# Patient Record
Sex: Female | Born: 1995
Health system: Southern US, Community
[De-identification: ages and names within clinical notes are randomized; demographics above are authoritative.]

## PROBLEM LIST (undated history)

## (undated) DIAGNOSIS — R42 Dizziness and giddiness: Secondary | ICD-10-CM

---
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
## 2014-03-18 ENCOUNTER — Ambulatory Visit (INDEPENDENT_AMBULATORY_CARE_PROVIDER_SITE_OTHER): Payer: 59 | Admitting: Family Medicine

## 2014-03-18 ENCOUNTER — Telehealth: Payer: Self-pay

## 2014-03-18 ENCOUNTER — Other Ambulatory Visit: Payer: Self-pay | Admitting: Family Medicine

## 2014-03-18 VITALS — BP 116/72 | HR 90 | Temp 99.0°F | Resp 18 | Ht 64.5 in | Wt 120.0 lb

## 2014-03-18 DIAGNOSIS — R059 Cough, unspecified: Secondary | ICD-10-CM

## 2014-03-18 DIAGNOSIS — R05 Cough: Secondary | ICD-10-CM

## 2014-03-18 DIAGNOSIS — Z209 Contact with and (suspected) exposure to unspecified communicable disease: Secondary | ICD-10-CM

## 2014-03-18 DIAGNOSIS — Z0000 Encounter for general adult medical examination without abnormal findings: Secondary | ICD-10-CM

## 2014-03-18 DIAGNOSIS — Z13220 Encounter for screening for lipoid disorders: Secondary | ICD-10-CM

## 2014-03-18 DIAGNOSIS — Z23 Encounter for immunization: Secondary | ICD-10-CM

## 2014-03-18 DIAGNOSIS — Z1329 Encounter for screening for other suspected endocrine disorder: Secondary | ICD-10-CM

## 2014-03-18 DIAGNOSIS — Z131 Encounter for screening for diabetes mellitus: Secondary | ICD-10-CM

## 2014-03-18 DIAGNOSIS — Z130 Encounter for screening for diseases of the blood and blood-forming organs and certain disorders involving the immune mechanism: Secondary | ICD-10-CM

## 2014-03-18 LAB — CBC
HCT: 39.8 % (ref 36.0–46.0)
Hemoglobin: 13.3 g/dL (ref 12.0–15.0)
MCH: 28.6 pg (ref 26.0–34.0)
MCHC: 33.4 g/dL (ref 30.0–36.0)
MCV: 85.6 fL (ref 78.0–100.0)
PLATELETS: 233 10*3/uL (ref 150–400)
RBC: 4.65 MIL/uL (ref 3.87–5.11)
RDW: 13.4 % (ref 11.5–15.5)
WBC: 6.1 10*3/uL (ref 4.0–10.5)

## 2014-03-18 MED ORDER — HYDROCODONE-HOMATROPINE 5-1.5 MG PO TABS: ORAL_TABLET | ORAL | Status: DC

## 2014-03-18 MED ORDER — HYDROCODONE-HOMATROPINE 5-1.5 MG/5ML PO SYRP: 5.0000 mL | ORAL_SOLUTION | Freq: Three times a day (TID) | ORAL | Status: DC | PRN

## 2014-03-18 MED ORDER — BENZONATATE 100 MG PO CAPS: 100.0000 mg | ORAL_CAPSULE | Freq: Three times a day (TID) | ORAL | Status: DC | PRN

## 2014-03-18 MED ORDER — BENZONATATE 100 MG PO CAPS: 100.0000 mg | ORAL_CAPSULE | Freq: Two times a day (BID) | ORAL | Status: DC | PRN

## 2014-03-18 NOTE — Telephone Encounter (Signed)
Patient's mother called. States that every pharmacy they've taken the hydrocodine Dr. Patsy Lageropland wrote her today will not fill the rx. Mother states she was told that no one carries this med. Please return call and advise. CB # [phone]

## 2014-03-18 NOTE — Patient Instructions (Signed)
Good to see you today!  Set up your mychart account asap so I can send you your lab results Use the tessalon perles as needed for cough- if you need something stronger you can use the hycodan syrup The hycodan will make you sleepy so do not use when you need to drive!   Best wishes for success in your studies!

## 2014-03-18 NOTE — Progress Notes (Signed)
[facility] [address], [place] [id number] [phone]  Date:  03/18/2014   Name:  Leah Clayton   DOB:  1995/11/22   MRN:  [id number]  PCP:  No primary care provider on file.    Chief Complaint: Annual Exam   History of Present Illness:  Leah Kingfishermani Chaires is a 18 y.o. very pleasant female patient who presents with the following:  She is here today for labs/ PE for school.   She is entering a 2 year [employer]N program at [employer].  Per he forms she needs a hep b titer and quantiferon gold test  She also notes a cough for 3-4 days, it can be worse at night.  No fever, she generally feels ok.  She would like something to help her not cough at night.   She does have a history of asthma but this is no longer bothersome to her Never a smoker Depo- provera   There are no active problems to display for this patient.   Past Medical History  Diagnosis Date  . Asthma     No past surgical history on file.  History  Substance Use Topics  . Smoking status: Never Smoker   . Smokeless tobacco: Not on file  . Alcohol Use: No    No family history on file.  Allergies  Allergen Reactions  . Shellfish Allergy Itching and Swelling    Medication list has been reviewed and updated.  No current outpatient prescriptions on file prior to visit.   No current facility-administered medications on file prior to visit.    Review of Systems:  As per HPI- otherwise negative.   Physical Examination: Filed Vitals:   03/18/14 1415  BP: 116/72  Pulse: 90  Temp: 99 F (37.2 C)  Resp: 18   Filed Vitals:   03/18/14 1415  Height: 5' 4.5" (1.638 m)  Weight: 120 lb (54.432 kg)   Body mass index is 20.29 kg/(m^2). Ideal Body Weight: Weight in (lb) to have BMI = 25: 147.6  GEN: WDWN, NAD, Non-toxic, A & O x 3, looks well HEENT: Atraumatic, Normocephalic. Neck supple. No masses, No LAD.  Bilateral TM wnl, oropharynx normal.  PEERL,EOMI.   Ears and Nose: No external  deformity. CV: RRR, No M/G/R. No JVD. No thrill. No extra heart sounds. PULM: CTA B, no wheezes, crackles, rhonchi. No retractions. No resp. distress. No accessory muscle use. ABD: S, NT, ND EXTR: No c/c/e NEURO Normal gait.  PSYCH: Normally interactive. Conversant. Not depressed or anxious appearing.  Calm demeanor.    Assessment and Plan: Physical exam  Exposure to communicable disease - Plan: Flu Vaccine QUAD 36+ mos IM, Hepatitis B surface antibody  Immunization due - Plan: Quantiferon tb gold assay, Measles/Mumps/Rubella Immunity  Screening for hyperlipidemia - Plan: Lipid panel  Screening for deficiency anemia - Plan: CBC  Screening for hypothyroidism - Plan: TSH  Screening for diabetes mellitus - Plan: Comprehensive metabolic panel  Cough - Plan: Hydrocodone-Homatropine 5-1.5 MG TABS, benzonatate (TESSALON) 100 MG capsule, DISCONTINUED: benzonatate (TESSALON) 100 MG capsule, DISCONTINUED: HYDROcodone-homatropine (HYCODAN) 5-1.5 MG/5ML syrup  Labs and quantiferon/ flu shot as above Tessalon perles and hycodan tablets (per request) for cough; let me know if not better soon  Signed Abbe AmsterdamJessica Sundae Maners, MD

## 2014-03-19 LAB — COMPREHENSIVE METABOLIC PANEL
ALBUMIN: 4.5 g/dL (ref 3.5–5.2)
ALK PHOS: 74 U/L (ref 39–117)
ALT: 9 U/L (ref 0–35)
AST: 15 U/L (ref 0–37)
BILIRUBIN TOTAL: 1.4 mg/dL — AB (ref 0.2–1.2)
BUN: 13 mg/dL (ref 6–23)
CO2: 24 mEq/L (ref 19–32)
Calcium: 9.4 mg/dL (ref 8.4–10.5)
Chloride: 104 mEq/L (ref 96–112)
Creat: 0.65 mg/dL (ref 0.50–1.10)
GLUCOSE: 83 mg/dL (ref 70–99)
POTASSIUM: 3.9 meq/L (ref 3.5–5.3)
SODIUM: 139 meq/L (ref 135–145)
TOTAL PROTEIN: 7.3 g/dL (ref 6.0–8.3)

## 2014-03-19 LAB — TSH: TSH: 1.114 u[IU]/mL (ref 0.350–4.500)

## 2014-03-19 LAB — HEPATITIS B SURFACE ANTIBODY, QUANTITATIVE: HEPATITIS B-POST: 0 m[IU]/mL

## 2014-03-19 LAB — LIPID PANEL
Cholesterol: 93 mg/dL (ref 0–200)
HDL: 64 mg/dL (ref >39)
LDL CALC: 21 mg/dL (ref 0–99)
TRIGLYCERIDES: 39 mg/dL (ref <150)
Total CHOL/HDL Ratio: 1.5 Ratio
VLDL: 8 mg/dL (ref 0–40)

## 2014-03-19 NOTE — Telephone Encounter (Signed)
PATIENT'S MOTHER STATES HER DAUGHTER SAW DR. Patsy LagerOPLAND ON Friday. SHE PRESCRIBED HER A NARCOTIC WHICH SHE HAS NOT BEEN ABLE TO FIND AT ANY PHARMACY. SHE CALLED BACK TO LET US KNOW THIS, BUT SHE NEVER RECEIVED A CALL BACK. IN THE MEANTIME, HER DAUGHTER'S COUGH IS GETTING WORSE. SHE WOULD LIKE A TABLET OR CAPSULE BE CALLED INTO HER PHARMACY AS SOON AS POSSIBLE PLEASE. HER DAUGHTER CAN NOT HAVE LIQUID. BEST PHONE [phone]  (MOTHER'S NAME IS KATINA)   PHARMACY CHOICE IS [facility] ON [address] ([place])  MBC

## 2014-03-20 LAB — QUANTIFERON TB GOLD ASSAY (BLOOD)
Interferon Gamma Release Assay: NEGATIVE
Mitogen value: 5.02 IU/mL
Quantiferon Nil Value: 0.02 IU/mL
Quantiferon Tb Ag Minus Nil Value: 0 IU/mL
TB AG VALUE: 0.02 [IU]/mL

## 2014-03-21 LAB — MEASLES/MUMPS/RUBELLA IMMUNITY
Mumps IgG: 108 AU/mL — ABNORMAL HIGH (ref <9.00)
RUBELLA: 3.27 {index} — AB (ref <0.90)
Rubeola IgG: 79 AU/mL — ABNORMAL HIGH (ref <25.00)

## 2014-03-21 NOTE — Telephone Encounter (Signed)
Lm for rtn call 

## 2014-03-21 NOTE — Telephone Encounter (Signed)
The pharmacy staff person I spoke with has never heard of Hycodan tablets. Another person there verifies that they  Have not carried the tablets in "quite some time."  We can send in something with codeine, or I can write a new prescription for something with hydrocodone.

## 2014-03-22 ENCOUNTER — Ambulatory Visit (INDEPENDENT_AMBULATORY_CARE_PROVIDER_SITE_OTHER): Payer: 59 | Admitting: Physician Assistant

## 2014-03-22 VITALS — BP 108/80 | HR 72 | Temp 98.8°F | Resp 16 | Ht 66.0 in | Wt 123.4 lb

## 2014-03-22 DIAGNOSIS — Z23 Encounter for immunization: Secondary | ICD-10-CM

## 2014-03-22 LAB — BILIRUBIN, FRACTIONATED(TOT/DIR/INDIR)
BILIRUBIN DIRECT: 0.4 mg/dL — AB (ref 0.0–0.3)
BILIRUBIN TOTAL: 1.3 mg/dL — AB (ref 0.2–1.2)
Indirect Bilirubin: 0.9 mg/dL (ref 0.2–1.2)

## 2014-03-22 NOTE — Progress Notes (Signed)
I was directly involved with the patient's care and agree with the physical, diagnosis and treatment plan.  

## 2014-03-22 NOTE — Progress Notes (Signed)
   Subjective:    Patient ID: Leah Clayton, female    DOB: 10/15/1995, 18 y.o.   MRN: [id number] There are no active problems to display for this patient.  Prior to Admission medications   Medication Sig Start Date End Date Taking? Authorizing Provider  benzonatate (TESSALON) 100 MG capsule Take 1 capsule (100 mg total) by mouth 3 (three) times daily as needed for cough. 03/18/14  Yes Pearline CablesJessica C Copland, MD  Hydrocodone-Homatropine 5-1.5 MG TABS Take 1 tablet by mouth every 8 hours as needed for cough 03/18/14  Yes Pearline CablesJessica C Copland, MD   Allergies  Allergen Reactions  . Shellfish Allergy Itching and Swelling    HPI  This is a 18 year old female presenting for 1st round of hepatitis B vaccination. 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 She will need two more vaccinations after today.  Review of Systems  Constitutional: Negative for fever and chills.  Skin: Negative.       Objective:   Physical Exam  Constitutional: She is oriented to person, place, and time. She appears well-developed and well-nourished. No distress.  HENT:  Head: Normocephalic and atraumatic.  Right Ear: Hearing normal.  Left Ear: Hearing normal.  Nose: Nose normal.  Eyes: Conjunctivae and lids are normal. Right eye exhibits no discharge. Left eye exhibits no discharge. No scleral icterus.  Pulmonary/Chest: Effort normal. No respiratory distress.  Musculoskeletal: Normal range of motion.  Neurological: She is alert and oriented to person, place, and time.  Skin: Skin is warm, dry and intact. No lesion and no rash noted.  Psychiatric: She has a normal mood and affect. Her speech is normal and behavior is normal. Thought content normal.      Assessment & Plan:  1. Need for hepatitis B vaccination Patient was given first round of hep B vaccine today. She will return in 1 month for her 2nd and 6 months for her  third.  Roswell MinersNicole V. Dyke BrackettBush, PA-C, MHS [facility]  03/22/2014

## 2014-03-22 NOTE — Telephone Encounter (Signed)
Clld pt - Pt advsd that she  did locate tablets [facility] had the [facility] in [facility] [address].

## 2014-03-22 NOTE — Patient Instructions (Signed)
Return in 1 month for 2nd vaccine. Return for 3rd dose in 6 months.

## 2015-07-17 DIAGNOSIS — Z3042 Encounter for surveillance of injectable contraceptive: Secondary | ICD-10-CM | POA: Diagnosis not present

## 2015-10-10 DIAGNOSIS — Z3042 Encounter for surveillance of injectable contraceptive: Secondary | ICD-10-CM | POA: Diagnosis not present

## 2015-12-29 DIAGNOSIS — Z6824 Body mass index (BMI) 24.0-24.9, adult: Secondary | ICD-10-CM | POA: Diagnosis not present

## 2015-12-29 DIAGNOSIS — Z124 Encounter for screening for malignant neoplasm of cervix: Secondary | ICD-10-CM | POA: Diagnosis not present

## 2015-12-29 DIAGNOSIS — Z113 Encounter for screening for infections with a predominantly sexual mode of transmission: Secondary | ICD-10-CM | POA: Diagnosis not present

## 2015-12-29 DIAGNOSIS — Z3042 Encounter for surveillance of injectable contraceptive: Secondary | ICD-10-CM | POA: Diagnosis not present

## 2015-12-29 DIAGNOSIS — Z1389 Encounter for screening for other disorder: Secondary | ICD-10-CM | POA: Diagnosis not present

## 2015-12-29 DIAGNOSIS — Z130 Encounter for screening for diseases of the blood and blood-forming organs and certain disorders involving the immune mechanism: Secondary | ICD-10-CM | POA: Diagnosis not present

## 2015-12-29 DIAGNOSIS — Z1151 Encounter for screening for human papillomavirus (HPV): Secondary | ICD-10-CM | POA: Diagnosis not present

## 2015-12-29 DIAGNOSIS — Z3009 Encounter for other general counseling and advice on contraception: Secondary | ICD-10-CM | POA: Diagnosis not present

## 2015-12-29 DIAGNOSIS — Z01419 Encounter for gynecological examination (general) (routine) without abnormal findings: Secondary | ICD-10-CM | POA: Diagnosis not present

## 2016-03-22 DIAGNOSIS — Z3042 Encounter for surveillance of injectable contraceptive: Secondary | ICD-10-CM | POA: Diagnosis not present

## 2016-06-09 DIAGNOSIS — R509 Fever, unspecified: Secondary | ICD-10-CM | POA: Diagnosis not present

## 2016-06-09 DIAGNOSIS — R112 Nausea with vomiting, unspecified: Secondary | ICD-10-CM | POA: Diagnosis not present

## 2016-06-09 DIAGNOSIS — R197 Diarrhea, unspecified: Secondary | ICD-10-CM | POA: Diagnosis not present

## 2016-06-14 DIAGNOSIS — H52223 Regular astigmatism, bilateral: Secondary | ICD-10-CM | POA: Diagnosis not present

## 2016-06-14 DIAGNOSIS — Z3042 Encounter for surveillance of injectable contraceptive: Secondary | ICD-10-CM | POA: Diagnosis not present

## 2016-06-14 MED FILL — MEDROXYPROG 150 MG/ML SYR: 150 | 84 days supply | Qty: 1 | Fill #0

## 2016-09-02 DIAGNOSIS — Z3042 Encounter for surveillance of injectable contraceptive: Secondary | ICD-10-CM | POA: Diagnosis not present

## 2016-09-02 MED FILL — MEDROXYPROG 150 MG/ML SYR: 150 | 84 days supply | Qty: 1 | Fill #1

## 2016-11-26 MED FILL — MEDROXYPROG 150 MG/ML SYR: 150 | 84 days supply | Qty: 1 | Fill #2

## 2016-11-28 DIAGNOSIS — Z3042 Encounter for surveillance of injectable contraceptive: Secondary | ICD-10-CM | POA: Diagnosis not present

## 2017-02-17 DIAGNOSIS — J019 Acute sinusitis, unspecified: Secondary | ICD-10-CM | POA: Diagnosis not present

## 2017-02-17 MED FILL — CEFDINIR 300 MG CAPSULE: 300 | 10 days supply | Qty: 20 | Fill #0

## 2017-02-19 DIAGNOSIS — Z3042 Encounter for surveillance of injectable contraceptive: Secondary | ICD-10-CM | POA: Diagnosis not present

## 2017-02-19 DIAGNOSIS — Z113 Encounter for screening for infections with a predominantly sexual mode of transmission: Secondary | ICD-10-CM | POA: Diagnosis not present

## 2017-02-19 DIAGNOSIS — Z01419 Encounter for gynecological examination (general) (routine) without abnormal findings: Secondary | ICD-10-CM | POA: Diagnosis not present

## 2017-02-19 DIAGNOSIS — Z124 Encounter for screening for malignant neoplasm of cervix: Secondary | ICD-10-CM | POA: Diagnosis not present

## 2017-02-19 DIAGNOSIS — Z1151 Encounter for screening for human papillomavirus (HPV): Secondary | ICD-10-CM | POA: Diagnosis not present

## 2017-02-20 DIAGNOSIS — Z1151 Encounter for screening for human papillomavirus (HPV): Secondary | ICD-10-CM | POA: Diagnosis not present

## 2017-02-20 DIAGNOSIS — Z124 Encounter for screening for malignant neoplasm of cervix: Secondary | ICD-10-CM | POA: Diagnosis not present

## 2017-02-27 DIAGNOSIS — L293 Anogenital pruritus, unspecified: Secondary | ICD-10-CM | POA: Diagnosis not present

## 2017-02-27 DIAGNOSIS — B373 Candidiasis of vulva and vagina: Secondary | ICD-10-CM | POA: Diagnosis not present

## 2017-05-07 DIAGNOSIS — Z3042 Encounter for surveillance of injectable contraceptive: Secondary | ICD-10-CM | POA: Diagnosis not present

## 2017-07-10 ENCOUNTER — Ambulatory Visit (INDEPENDENT_AMBULATORY_CARE_PROVIDER_SITE_OTHER): Payer: Self-pay | Admitting: Nurse Practitioner

## 2017-07-10 VITALS — BP 110/80 | HR 91 | Temp 99.1°F | Resp 18 | Wt 178.2 lb

## 2017-07-10 DIAGNOSIS — J029 Acute pharyngitis, unspecified: Secondary | ICD-10-CM

## 2017-07-10 DIAGNOSIS — J040 Acute laryngitis: Secondary | ICD-10-CM

## 2017-07-10 MED ORDER — PREDNISONE 20 MG PO TABS: 40.0000 mg | ORAL_TABLET | Freq: Every day | ORAL | 0 refills | Status: AC

## 2017-07-10 NOTE — Patient Instructions (Addendum)
Laryngitis  Laryngitis is inflammation of your vocal cords. This causes hoarseness, coughing, loss of voice, sore throat, or a dry throat. Your vocal cords are two bands of muscles that are found in your throat. When you speak, these cords come together and vibrate. These vibrations come out through your mouth as sound. When your vocal cords are inflamed, your voice sounds different.  Laryngitis can be temporary (acute) or long-term (chronic). Most cases of acute laryngitis improve with time. Chronic laryngitis is laryngitis that lasts for more than three weeks.  What are the causes?  Acute laryngitis may be caused by:  · A viral infection.  · Lots of talking, yelling, or singing. This is also called vocal strain.  · Bacterial infections.    Chronic laryngitis may be caused by:  · Vocal strain.  · Injury to your vocal cords.  · Acid reflux (gastroesophageal reflux disease or GERD).  · Allergies.  · Sinus infection.  · Smoking.  · Alcohol abuse.  · Breathing in chemicals or dust.  · Growths on the vocal cords.    What increases the risk?  Risk factors for laryngitis include:  · Smoking.  · Alcohol abuse.  · Having allergies.    What are the signs or symptoms?  Symptoms of laryngitis may include:  · Low, hoarse voice.  · Loss of voice.  · Dry cough.  · Sore throat.  · Stuffy nose.    How is this diagnosed?  Laryngitis may be diagnosed by:  · Physical exam.  · Throat culture.  · Blood test.  · Laryngoscopy. This procedure allows your health care provider to look at your vocal cords with a mirror or viewing tube.    How is this treated?  Treatment for laryngitis depends on what is causing it. Usually, treatment involves resting your voice and using medicines to soothe your throat. However, if your laryngitis is caused by a bacterial infection, you may need to take antibiotic medicine. If your laryngitis is caused by a growth, you may need to have a procedure to remove it.  Follow these instructions at home:  · Drink  enough fluid to keep your urine clear or pale yellow.  · Breathe in moist air. Use a humidifier if you live in a dry climate.  · Take medicines only as directed by your health care provider.  · If you were prescribed an antibiotic medicine, finish it all even if you start to feel better.  · Do not smoke cigarettes or electronic cigarettes. If you need help quitting, ask your health care provider.  · Talk as little as possible. Also avoid whispering, which can cause vocal strain.  · Write instead of talking. Do this until your voice is back to normal.  Contact a health care provider if:  · You have a fever.  · You have increasing pain.  · You have difficulty swallowing.  Get help right away if:  · You cough up blood.  · You have trouble breathing.  This information is not intended to replace advice given to you by your health care provider. Make sure you discuss any questions you have with your health care provider.  Document Released: 04/29/2005 Document Revised: 10/05/2015 Document Reviewed: 10/12/2013  Elsevier Interactive Patient Education © 2018 [employer]      Sore Throat  A sore throat is pain, burning, irritation, or scratchiness in the throat. When you have a sore throat, you may feel pain or tenderness in your throat   when you swallow or talk.  Many things can cause a sore throat, including:  · An infection.  · Seasonal allergies.  · Dryness in the air.  · Irritants, such as smoke or pollution.  · Gastroesophageal reflux disease (GERD).  · A tumor.    A sore throat is often the first sign of another sickness. It may happen with other symptoms, such as coughing, sneezing, fever, and swollen neck glands. Most sore throats go away without medical treatment.  Follow these instructions at home:  · Take over-the-counter medicines only as told by your health care provider.  · Drink enough fluids to keep your urine clear or pale yellow.  · Rest as needed.  · To help with pain, try:  ? Sipping warm liquids, such  as broth, herbal tea, or warm water.  ? Eating or drinking cold or frozen liquids, such as frozen ice pops.  ? Gargling with a salt-water mixture 3-4 times a day or as needed. To make a salt-water mixture, completely dissolve ½-1 tsp of salt in 1 cup of warm water.  ? Sucking on hard candy or throat lozenges.  ? Putting a cool-mist humidifier in your bedroom at night to moisten the air.  ? Sitting in the bathroom with the door closed for 5-10 minutes while you run hot water in the shower.  · Do not use any tobacco products, such as cigarettes, chewing tobacco, and e-cigarettes. If you need help quitting, ask your health care provider.  Contact a health care provider if:  · You have a fever for more than 2-3 days.  · You have symptoms that last (are persistent) for more than 2-3 days.  · Your throat does not get better within 7 days.  · You have a fever and your symptoms suddenly get worse.  Get help right away if:  · You have difficulty breathing.  · You cannot swallow fluids, soft foods, or your saliva.  · You have increased swelling in your throat or neck.  · You have persistent nausea and vomiting.  This information is not intended to replace advice given to you by your health care provider. Make sure you discuss any questions you have with your health care provider.  Document Released: 06/06/2004 Document Revised: 12/24/2015 Document Reviewed: 02/17/2015  Elsevier Interactive Patient Education © 2018 Elsevier Inc.

## 2017-07-10 NOTE — Progress Notes (Signed)
Subjective:     Leah Clayton is a 22 y.o. female who presents for evaluation of sore throat. Associated symptoms include chills, dry cough, bilateral ear fullness and pressure, nasal blockage, sore throat and swollen glands. Onset of symptoms was 3 days ago, and have been gradually worsening since that time. She is drinking moderate amounts of fluids. She has not had a recent close exposure to someone with proven streptococcal pharyngitis.  The following portions of the patient's history were reviewed and updated as appropriate: allergies, current medications and past medical history.  Review of Systems Constitutional: positive for chills, fatigue and sweats, negative for anorexia, night sweats and weight loss Eyes: negative Ears, nose, mouth, throat, and face: positive for hoarseness, nasal congestion, sore throat and ear pressure, fullness bilaterally, negative for ear drainage and earaches Respiratory: positive for cough, negative for asthma, chronic bronchitis, dyspnea on exertion, sputum, stridor and wheezing Cardiovascular: negative Gastrointestinal: positive for decreased appetite, negative for abdominal pain, change in bowel habits, constipation, nausea and vomiting Neurological: negative Allergic/Immunologic: positive for hay fever, negative for anaphylaxis and urticaria    Objective:    BP 110/80 (BP Location: Right Arm, Patient Position: Sitting, Cuff Size: Normal)   Pulse 91   Temp 99.1 F (37.3 C) (Oral)   Resp 18   Wt 178 lb 3.2 oz (80.8 kg)   SpO2 98%   BMI 28.76 kg/m  General appearance: alert, cooperative and no distress Head: Normocephalic, without obvious abnormality, atraumatic Eyes: conjunctivae/corneas clear. PERRL, EOM's intact. Fundi benign. Ears: normal TM and external ear canal left ear and abnormal TM right ear - mucoid middle ear fluid Nose: no discharge, turbinates swollen, inflamed, no sinus tenderness Throat: abnormal findings: moderate  oropharyngeal erythema Lungs: clear to auscultation bilaterally Heart: regular rate and rhythm, S1, S2 normal, no murmur, click, rub or gallop Abdomen: soft, non-tender; bowel sounds normal; no masses,  no organomegaly Pulses: 2+ and symmetric Skin: Skin color, texture, turgor normal. No rashes or lesions Lymph nodes: cervical lyphmadenopathy bilaterally Neurologic: Grossly normal  Laboratory Strep test done. Results:negative.    Assessment:    Acute Laryngitis   Plan:    Use of OTC analgesics recommended as well as salt water gargles. Follow up as needed. Increase fluids to avoid dehydration.  Ibuprofen or Tylenol for pain, fever or general discomfort.  Use cough drops for comfort.  Soft diet with warm or cool liquids until symptoms improve.    Prednisone 40mg  daily for 5 days.

## 2017-07-18 ENCOUNTER — Ambulatory Visit (INDEPENDENT_AMBULATORY_CARE_PROVIDER_SITE_OTHER): Payer: Self-pay | Admitting: Emergency Medicine

## 2017-07-18 VITALS — BP 120/80 | HR 98 | Temp 98.9°F | Wt 177.4 lb

## 2017-07-18 DIAGNOSIS — H8303 Labyrinthitis, bilateral: Secondary | ICD-10-CM

## 2017-07-18 DIAGNOSIS — J302 Other seasonal allergic rhinitis: Secondary | ICD-10-CM

## 2017-07-18 MED ORDER — MONTELUKAST SODIUM 10 MG PO TABS: 10.0000 mg | ORAL_TABLET | Freq: Every day | ORAL | 3 refills | Status: DC

## 2017-07-18 MED ORDER — MECLIZINE HCL 25 MG PO TABS: 25.0000 mg | ORAL_TABLET | Freq: Three times a day (TID) | ORAL | 0 refills | Status: DC | PRN

## 2017-07-18 MED ORDER — BUDESONIDE 32 MCG/ACT NA SUSP: 1.0000 | Freq: Two times a day (BID) | NASAL | 3 refills | Status: AC

## 2017-07-18 MED ORDER — LEVOCETIRIZINE DIHYDROCHLORIDE 5 MG PO TABS: 5.0000 mg | ORAL_TABLET | ORAL | 3 refills | Status: AC

## 2017-07-18 MED FILL — MONTELUKAST SOD 10 MG TAB: 10 | 90 days supply | Qty: 90 | Fill #0

## 2017-07-18 MED FILL — LEVOCETIRIZINE 5 MG TABLET: 5 | 90 days supply | Qty: 90 | Fill #0

## 2017-07-18 MED FILL — MECLIZINE 25 MG TABLET: 25 | 10 days supply | Qty: 30 | Fill #0

## 2017-07-18 NOTE — Patient Instructions (Signed)
Allergic Rhinitis, Adult Allergic rhinitis is an allergic reaction that affects the mucous membrane inside the nose. It causes sneezing, a runny or stuffy nose, and the feeling of mucus going down the back of the throat (postnasal drip). Allergic rhinitis can be mild to severe. There are two types of allergic rhinitis:  Seasonal. This type is also called hay fever. It happens only during certain seasons.  Perennial. This type can happen at any time of the year.  What are the causes? This condition happens when the body's defense system (immune system) responds to certain harmless substances called allergens as though they were germs.  Seasonal allergic rhinitis is triggered by pollen, which can come from grasses, trees, and weeds. Perennial allergic rhinitis may be caused by:  House dust mites.  Pet dander.  Mold spores.  What are the signs or symptoms? Symptoms of this condition include:  Sneezing.  Runny or stuffy nose (nasal congestion).  Postnasal drip.  Itchy nose.  Tearing of the eyes.  Trouble sleeping.  Daytime sleepiness.  How is this diagnosed? This condition may be diagnosed based on:  Your medical history.  A physical exam.  Tests to check for related conditions, such as: ? Asthma. ? Pink eye. ? Ear infection. ? Upper respiratory infection.  Tests to find out which allergens trigger your symptoms. These may include skin or blood tests.  How is this treated? There is no cure for this condition, but treatment can help control symptoms. Treatment may include:  Taking medicines that block allergy symptoms, such as antihistamines. Medicine may be given as a shot, nasal spray, or pill.  Avoiding the allergen.  Desensitization. This treatment involves getting ongoing shots until your body becomes less sensitive to the allergen. This treatment may be done if other treatments do not help.  If taking medicine and avoiding the allergen does not work, new,  stronger medicines may be prescribed.  Follow these instructions at home:  Find out what you are allergic to. Common allergens include smoke, dust, and pollen.  Avoid the things you are allergic to. These are some things you can do to help avoid allergens: ? Replace carpet with wood, tile, or vinyl flooring. Carpet can trap dander and dust. ? Do not smoke. Do not allow smoking in your home. ? Change your heating and air conditioning filter at least once a month. ? During allergy season:  Keep windows closed as much as possible.  Plan outdoor activities when pollen counts are lowest. This is usually during the evening hours.  When coming indoors, change clothing and shower before sitting on furniture or bedding.  Take over-the-counter and prescription medicines only as told by your health care provider.  Keep all follow-up visits as told by your health care provider. This is important. Contact a health care provider if:  You have a fever.  You develop a persistent cough.  You make whistling sounds when you breathe (you wheeze).  Your symptoms interfere with your normal daily activities. Get help right away if:  You have shortness of breath. Summary  This condition can be managed by taking medicines as directed and avoiding allergens.  Contact your health care provider if you develop a persistent cough or fever.  During allergy season, keep windows closed as much as possible. This information is not intended to replace advice given to you by your health care provider. Make sure you discuss any questions you have with your health care provider. Document Released: 01/22/2001 Document Revised: 06/06/2016  Document Reviewed: 06/06/2016 Elsevier Interactive Patient Education  [employer]2018 [employer] Labyrinthitis Labyrinthitis is an infection of the inner ear. Your inner ear is a system of tubes and canals (labyrinth). These are filled with fluid. Nerve cells in your inner ear send  signals for hearing and balance to your brain. When tiny germs get inside the tubes and canals, they harm the cells that send messages to the brain. This can cause changes in hearing and balance. Follow these instructions at home:  Take medicines only as told by your doctor.  If you were prescribed an antibiotic medicine, finish all of it even if you start to feel better.  Rest as much as possible.  Avoid loud noises and bright lights.  Do not make sudden movements until any dizziness goes away.  Do not drive until your doctor says that you can.  Drink enough fluid to keep your pee (urine) clear or pale yellow.  Work with a physical therapist if you still feel dizzy after several weeks. A therapist can teach you exercises to help you deal with your dizziness.  Keep all follow-up visits as told by your doctor. This is important. Contact a doctor if:  Your symptoms do not get better with medicines.  You do not get better after two weeks.  You have a fever. Get help right away if:  You are very dizzy.  You keep throwing up (vomiting) or keep feeling sick to your stomach (nauseous).  Your hearing gets a lot worse very quickly. This information is not intended to replace advice given to you by your health care provider. Make sure you discuss any questions you have with your health care provider. Document Released: 04/29/2005 Document Revised: 10/05/2015 Document Reviewed: 02/08/2014 Elsevier Interactive Patient Education  [employer]2018 [employer]

## 2017-07-18 NOTE — Progress Notes (Signed)
Vertigo Patient presents for evaluation of dizziness. The symptoms started yesterday and have been intermittent. The attacks occur frequently and last a few minutes. Positions that worsen symptoms: bending over, head back and turning head. Previous workup/treatments: none. Associated ear symptoms: aural pressure. Associated CNS symptoms: none. Recent infections: upper respiratory infection. Head trauma: denied. Drug ingestion: none. Noise exposure: no occupational exposure.  Review of Systems  Constitutional: Negative for chills, fever and malaise/fatigue.  HENT: Positive for congestion, ear pain (pressure) and sinus pain (pressure). Negative for hearing loss and tinnitus.   Eyes: Negative for blurred vision and redness.  Respiratory: Negative.   Cardiovascular: Negative.   Gastrointestinal: Negative.   Neurological: Positive for dizziness. Negative for tingling, sensory change, focal weakness and headaches.    O: Vitals:   07/18/17 1022  BP: 120/80  Pulse: 98  Temp: 98.9 F (37.2 C)   Physical Exam  Constitutional: She appears well-developed and well-nourished. No distress.  HENT:  Head: Normocephalic and atraumatic.  Right Ear: External ear normal. A middle ear effusion is present.  Left Ear: External ear normal. A middle ear effusion is present.  Nose: Right sinus exhibits maxillary sinus tenderness. Left sinus exhibits maxillary sinus tenderness.  Mouth/Throat: Uvula is midline and oropharynx is clear and moist. No tonsillar exudate.  Eyes: Conjunctivae are normal.  Neck: Normal range of motion.  Cardiovascular: Normal rate and regular rhythm.  Pulmonary/Chest: Effort normal and breath sounds normal.  Lymphadenopathy:    She has no cervical adenopathy.  Neurological: She is alert.  Skin: Skin is warm and dry. Capillary refill takes less than 2 seconds.  Psychiatric: She has a normal mood and affect.  Nursing note and vitals reviewed.  A: 1. Labyrinthitis of both ears    2. Seasonal allergic rhinitis, unspecified trigger     P: 1. Labyrinthitis of both ears  - budesonide (RHINOCORT ALLERGY) 32 MCG/ACT nasal spray; Place 1 spray into both nostrils 2 (two) times daily.  Dispense: 1 Bottle; Refill: 3 - meclizine (ANTIVERT) 25 MG tablet; Take 1 tablet (25 mg total) by mouth 3 (three) times daily as needed for dizziness.  Dispense: 30 tablet; Refill: 0  2. Seasonal allergic rhinitis, unspecified trigger  - levocetirizine (XYZAL) 5 MG tablet; Take 1 tablet (5 mg total) by mouth every morning.  Dispense: 90 tablet; Refill: 3 - montelukast (SINGULAIR) 10 MG tablet; Take 1 tablet (10 mg total) by mouth at bedtime.  Dispense: 90 tablet; Refill: 3 - budesonide (RHINOCORT ALLERGY) 32 MCG/ACT nasal spray; Place 1 spray into both nostrils 2 (two) times daily.  Dispense: 1 Bottle; Refill: 3

## 2017-07-23 MED FILL — medroxyPROGESTERone ACETATE: 150 | 90 days supply | Qty: 1 | Fill #0

## 2017-11-19 ENCOUNTER — Ambulatory Visit (INDEPENDENT_AMBULATORY_CARE_PROVIDER_SITE_OTHER): Payer: No Typology Code available for payment source | Admitting: Family Medicine

## 2017-11-19 VITALS — BP 108/82 | Ht 64.5 in | Wt 176.0 lb

## 2017-11-19 DIAGNOSIS — S8391XA Sprain of unspecified site of right knee, initial encounter: Secondary | ICD-10-CM | POA: Diagnosis not present

## 2017-11-19 NOTE — Patient Instructions (Signed)
Your knee exam is very reassuring. This is consistent with a mild sprain of your knee. Icing, tylenol or motrin only if needed. Straight leg raises, knee extensions, hamstring curls, and hamstring swings 3 sets of 10 once a day. Add ankle weight if these become too easy. Knee brace only if needed also. Have fun on your cruise! Call me if you're not improving as expected over the next 4-6 weeks.

## 2017-11-19 NOTE — Progress Notes (Signed)
Leah Clayton is a 22 y.o. female who is here for right knee pain, accompanied by her mom.     HPI: Patient is a 22 year old healthy female who comes in for initial evaluation of right knee pain x1 to 2 months.  Patient cannot recall exactly when the pain started or any preceding injury.  Describes intermittent anterior and posterior knee pain.  4-5/10 pain.  Pain increases with full extension of the leg, but she feels like her both of her legs have always hyperextended.  Pain also increases with driving, prolonged standing, and going upstairs.  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 She denies any abnormal movement in kneecap, but is afraid it will buckle again. Denies any popping or locking.  No known swelling or bruising.  No history of previous knee injuries.  Does not do any regular physical activity and was not involved in competitive sports.  Reports rare dull ache in left knee, but otherwise no other joint pain or swelling.  Denies any foot or ankle pain.  No fever, chills, weakness, or other general change in health.  She has gained 40 pounds in the last 2 years which she attributes to her Depo shot.  No treatment tried for her current knee pain.  Family is supposed to leave this weekend to go on a cruise for a week.   Review of systems:  As stated above   Interval past medical history, surgical history, family history, and social history obtained and reviewed.   There are no active problems to display for this patient.  Review of systems:  As stated above   Interval past medical history, surgical history, family history, and social history obtained and reviewed.  Physical Exam:  BP 108/82   Ht 5' 4.5" (1.638 m)   Wt 176 lb (79.8 kg)   BMI 29.74 kg/m    Physical Exam Physical exam: Vital signs are reviewed and are documented in the chart Gen.: Alert, oriented, appears stated age, in no apparent  distress HEENT: Moist oral mucosa Respiratory: Normal respirations, able to speak in full sentences Cardiac: Regular rate, distal pulses 2+ Integumentary: No rashes on visible skin:  Neurologic: Strength 5/5 with knee flexion and extension, as well as ankle dorsiflexion, plantar flexion, inversion, eversion, sensation intact throughout bilateral lower extremities  Psych: Normal affect, mood is described as good Musculoskeletal: No erythema or ecchymosis of knees bilaterally.  No noticeable deformity. Hyperextension of knees equal bilaterally. Flat feet but normal gait without significant pronation of ankles. Poor muscle bulk of quads bilaterally.  Right knee: Mild diffuse edema vs. Left, but no ballotable effusion. Fullness appreciated at posterior knee - tender to palpation but no distinct mass. No joint line or other bony tenderness. Normal tracking of patella without crepitus. No excessive mobility of kneecap. Normal flexion and mild hyperextension (same on left). No pain with ROM testing.  Negative Lachmann's, anterior/posterior drawer, Mcmurray's, Thessaly's and lever tests. No pain with valgus or varus stress. Negative Dial test.  Left knee: No effusion. No joint line or other bony tenderness. Normal tracking of patella without crepitus. No excessive mobility of kneecap. Normal flexion and mild hyperextension. No pain with ROM testing.  Negative Lachmann's, anterior/posterior drawer, Mcmurray's, Thessaly's and lever tests. No pain with valgus or varus stress. Negative Dial test.  Ultrasound of knee: No significant effusion or baker's cyst. Parts of meninsci viewed were intact. Normal patellar  tendon. No osteophytes or narrowing of joint space visualized.  Assessment/Plan: Kasee is a 22yr old healthy female who is her for initial evaluation of right knee pain. Has mild fullness and tenderness of right posterior knee and hyperextension of knees bilaterally, but otherwise exam is  unremarkable, with no signs of ligamentous or meniscal damage or instability. No baker's cyst, significant effusion, or other abnormality on bedside ultrasound.  Most likely, she has a mild right knee sprain. Cannot rule out a component of patellar femoral syndrome with poor quadriceps development, but less likely with current presentation. Believe she will do well with symptomatic treatment and with leg strengthening exercises.  1) Right knee sprain -recommend daily HEP with extensions, leg lifts, hamstring curls and swings -NSAIDs or tylenol PRN pain -no brace recommended today   Follow up: PRN for new or worsening symptoms   Annell Greening, MD, MS [facility] PGY3

## 2017-11-22 ENCOUNTER — Encounter: Payer: Self-pay | Admitting: Family Medicine

## 2017-11-22 DIAGNOSIS — R87612 Low grade squamous intraepithelial lesion on cytologic smear of cervix (LGSIL): Secondary | ICD-10-CM | POA: Insufficient documentation

## 2017-11-22 NOTE — Assessment & Plan Note (Signed)
-  recommend daily HEP with extensions, leg lifts, hamstring curls and swings -NSAIDs or tylenol PRN pain -no brace recommended today

## 2018-01-24 ENCOUNTER — Encounter: Payer: Self-pay | Admitting: Nurse Practitioner

## 2018-02-17 ENCOUNTER — Other Ambulatory Visit: Payer: Self-pay

## 2018-02-18 ENCOUNTER — Encounter: Payer: Self-pay | Admitting: Nurse Practitioner

## 2018-02-26 ENCOUNTER — Ambulatory Visit (INDEPENDENT_AMBULATORY_CARE_PROVIDER_SITE_OTHER): Payer: No Typology Code available for payment source | Admitting: Internal Medicine

## 2018-02-26 ENCOUNTER — Encounter: Payer: Self-pay | Admitting: Internal Medicine

## 2018-02-26 VITALS — BP 110/70 | HR 68 | Temp 98.4°F | Ht 64.5 in | Wt 173.2 lb

## 2018-02-26 DIAGNOSIS — R519 Headache, unspecified: Secondary | ICD-10-CM

## 2018-02-26 DIAGNOSIS — N912 Amenorrhea, unspecified: Secondary | ICD-10-CM | POA: Diagnosis not present

## 2018-02-26 DIAGNOSIS — N760 Acute vaginitis: Secondary | ICD-10-CM | POA: Diagnosis not present

## 2018-02-26 DIAGNOSIS — Z0000 Encounter for general adult medical examination without abnormal findings: Secondary | ICD-10-CM | POA: Diagnosis not present

## 2018-02-26 DIAGNOSIS — R42 Dizziness and giddiness: Secondary | ICD-10-CM

## 2018-02-26 DIAGNOSIS — R51 Headache: Secondary | ICD-10-CM

## 2018-02-26 LAB — POCT URINE PREGNANCY: Preg Test, Ur: NEGATIVE

## 2018-02-26 LAB — POCT URINALYSIS DIPSTICK
BILIRUBIN UA: NEGATIVE
GLUCOSE UA: NEGATIVE
Ketones, UA: NEGATIVE
Leukocytes, UA: NEGATIVE
Nitrite, UA: NEGATIVE
Protein, UA: NEGATIVE
RBC UA: NEGATIVE
SPEC GRAV UA: 1.02 (ref 1.010–1.025)
Urobilinogen, UA: 2 E.U./dL — AB
pH, UA: 6 (ref 5.0–8.0)

## 2018-02-26 MED ORDER — FLUCONAZOLE 150 MG PO TABS: 150.0000 mg | ORAL_TABLET | Freq: Every day | ORAL | 0 refills | Status: DC

## 2018-02-26 MED FILL — FLUCONAZOLE 150 MG TABS: 150 | 1 days supply | Qty: 1 | Fill #0

## 2018-02-26 NOTE — Progress Notes (Addendum)
Subjective:     Patient ID: Leah Clayton , female    DOB: May 30, 1995 , 22 y.o.   MRN: [id number]   HPI  Pt is here for her yearly physical. Last pap by GYN 2018 and was normal. Has a female sexual partner whom she has been x 1 year. She did not get her last Depo shot  In August since she has been gaining wt and been very hungry while on it. Uses condoms in the mean time. Has not made up her mind on what birth control to get on and will see her GYN for that.    Past Medical History:  Diagnosis Date  . Asthma       Current Outpatient Medications:  .  albuterol (PROAIR HFA) 108 (90 Base) MCG/ACT inhaler, ProAir HFA 90 mcg/actuation aerosol inhaler, Disp: , Rfl:  .  budesonide (RHINOCORT ALLERGY) 32 MCG/ACT nasal spray, Place 1 spray into both nostrils 2 (two) times daily., Disp: 1 Bottle, Rfl: 3 .  levocetirizine (XYZAL) 5 MG tablet, Take 1 tablet (5 mg total) by mouth every morning., Disp: 90 tablet, Rfl: 3 .  medroxyPROGESTERone (DEPO-PROVERA) 150 MG/ML injection, medroxyprogesterone 150 mg/mL intramuscular suspension  INJECT 1 ML INTO THE MUSCLE EVERY 3 MONTHS, Disp: , Rfl:  .  montelukast (SINGULAIR) 10 MG tablet, Take 1 tablet (10 mg total) by mouth at bedtime., Disp: 90 tablet, Rfl: 3   Allergies  Allergen Reactions  . Shellfish Allergy Itching and Swelling     Review of Systems  Constitutional: Positive for appetite change and unexpected weight change. Negative for activity change, chills, diaphoresis, fatigue and fever.       Wt gain with depo and increased hunger while on it but since she did not get the last injection, her appetite is down  HENT: Negative.   Eyes: Negative.   Respiratory: Negative for cough, shortness of breath and wheezing.   Cardiovascular: Negative for chest pain, palpitations and leg swelling.  Gastrointestinal: Positive for abdominal pain. Negative for abdominal distention, anal bleeding, blood in stool, constipation, diarrhea, nausea, rectal pain  and vomiting.       Has been getting intermittent heart burn, but none in 2 weeks.  Also been having intermittent lower abd pain off and on x 1-2 weeks described as cramps. And feels like her menses will start but has not.   Endocrine: Negative for cold intolerance, heat intolerance, polydipsia, polyphagia and polyuria.  Genitourinary: Negative for difficulty urinating, dyspareunia, dysuria, frequency, genital sores, vaginal discharge and vaginal pain.       Has been having vaginal itching x 1 week ago and used [employer] but itching is still there.  LMP several months ago.   Skin: Negative for color change, pallor, rash and wound.  Allergic/Immunologic: Negative for environmental allergies and food allergies.  Neurological: Positive for dizziness, light-headedness and headaches. Negative for tremors, seizures, syncope, speech difficulty, weakness and numbness.       Onset of light headnes this week, and had been sitting when this hits, is not vertigo, and does not get sweaty or nauseous.    Has on and off x 2 weeks. Since then Has are less intense  Hematological: Negative for adenopathy. Does not bruise/bleed easily.  Psychiatric/Behavioral: Negative for agitation, confusion, hallucinations, self-injury, sleep disturbance and suicidal ideas. The patient is not nervous/anxious.        Denies depression     Today's Vitals   02/26/18 0917  BP: 110/70  Pulse: 68  Temp:  98.4 F (36.9 C)  TempSrc: Oral  SpO2: 98%  Weight: 173 lb 3.2 oz (78.6 kg)  Height: 5' 4.5" (1.638 m)   Body mass index is 29.27 kg/m.   Objective:  Physical Exam  Constitutional: She is oriented to person, place, and time. She appears well-developed and well-nourished. No distress.  HENT:  Head: Normocephalic.  Mouth/Throat: Oropharynx is clear and moist.  Eyes: Pupils are equal, round, and reactive to light. EOM are normal.  Neck: Normal range of motion. Neck supple.  No thyroidmegally   Cardiovascular:  Normal rate, regular rhythm, normal heart sounds and intact distal pulses.  No murmur heard. Pulmonary/Chest: Effort normal and breath sounds normal.  Abdominal: Soft. Bowel sounds are normal. She exhibits no distension and no mass. There is no tenderness. There is no guarding.  Musculoskeletal: She exhibits no edema or tenderness.  Lymphadenopathy:    She has no cervical adenopathy.  Neurological: She is alert and oriented to person, place, and time. She displays normal reflexes. No cranial nerve deficit or sensory deficit. She displays a negative Romberg sign. Coordination and gait normal.  Skin: Skin is warm and dry. Capillary refill takes less than 2 seconds. No rash noted.  Psychiatric: She has a normal mood and affect. Her behavior is normal.  Vitals reviewed.   Vision with glasses R- 20/15 L- 20/20  Assessment And Plan:     1. Routine general medical examination at a health care facility  - POCT Urinalysis Dipstick (81002)- neg Urine pregnancy test- negative    2- Heaches- acute, but improving. Advised to push drinking more water in case is     due to dehydration  3- Dizziness- unknown cause- CBC, CMP and thyroid studies ordered  4- Vaginal itching- will see GYN for this.    Mathan Darroch RODRIGUEZ-SOUTHWORTH, PA-C

## 2018-02-26 NOTE — Patient Instructions (Signed)

## 2018-02-27 LAB — CMP14 + ANION GAP
ALBUMIN: 4.1 g/dL (ref 3.5–5.5)
ALT: 9 IU/L (ref 0–32)
ANION GAP: 17 mmol/L (ref 10.0–18.0)
AST: 14 IU/L (ref 0–40)
Albumin/Globulin Ratio: 1.4 (ref 1.2–2.2)
Alkaline Phosphatase: 108 IU/L (ref 39–117)
BUN / CREAT RATIO: 14 (ref 9–23)
BUN: 11 mg/dL (ref 6–20)
Bilirubin Total: 0.6 mg/dL (ref 0.0–1.2)
CALCIUM: 9.3 mg/dL (ref 8.7–10.2)
CO2: 22 mmol/L (ref 20–29)
CREATININE: 0.79 mg/dL (ref 0.57–1.00)
Chloride: 103 mmol/L (ref 96–106)
GFR calc non Af Amer: 105 mL/min/{1.73_m2} (ref >59)
GFR, EST AFRICAN AMERICAN: 121 mL/min/{1.73_m2} (ref >59)
Globulin, Total: 2.9 g/dL (ref 1.5–4.5)
Glucose: 86 mg/dL (ref 65–99)
Potassium: 4.4 mmol/L (ref 3.5–5.2)
Sodium: 142 mmol/L (ref 134–144)
TOTAL PROTEIN: 7 g/dL (ref 6.0–8.5)

## 2018-02-27 LAB — CBC
HEMOGLOBIN: 12.7 g/dL (ref 11.1–15.9)
Hematocrit: 39.6 % (ref 34.0–46.6)
MCH: 27.4 pg (ref 26.6–33.0)
MCHC: 32.1 g/dL (ref 31.5–35.7)
MCV: 85 fL (ref 79–97)
Platelets: 243 10*3/uL (ref 150–450)
RBC: 4.64 x10E6/uL (ref 3.77–5.28)
RDW: 13.1 % (ref 12.3–15.4)
WBC: 4.4 10*3/uL (ref 3.4–10.8)

## 2018-02-27 LAB — TSH: TSH: 1.08 u[IU]/mL (ref 0.450–4.500)

## 2018-02-27 LAB — T4, FREE: Free T4: 1.18 ng/dL (ref 0.82–1.77)

## 2018-02-27 LAB — T3, FREE: T3, Free: 2.9 pg/mL (ref 2.0–4.4)

## 2018-04-07 ENCOUNTER — Ambulatory Visit (INDEPENDENT_AMBULATORY_CARE_PROVIDER_SITE_OTHER): Payer: Self-pay | Admitting: Physician Assistant

## 2018-04-07 VITALS — BP 132/90 | HR 70 | Temp 98.8°F | Resp 20 | Wt 169.5 lb

## 2018-04-07 DIAGNOSIS — R519 Headache, unspecified: Secondary | ICD-10-CM

## 2018-04-07 DIAGNOSIS — R51 Headache: Secondary | ICD-10-CM

## 2018-04-07 NOTE — Progress Notes (Signed)
04/07/2018 6:22 PM   DOB: 10/21/1995 / MRN: [id number]  SUBJECTIVE:  Leah Clayton is a 22 y.o. female presenting for vertiginous-like dizziness that lasted 1 hour today total.  She associates a headache along with a gait abnormality.  Her friend gave her to ibuprofen she began to feel better after laying in a dark room for about an hour.  She is not had any advanced imaging.  This is not the first time this is happened.  Mother is going to take her to her primary care provider for referral to neurology along with, potentially, a CT scan.  She is allergic to shellfish allergy.   She  has a past medical history of Asthma.    She  reports that she has never smoked. She has never used smokeless tobacco. She reports that she drinks alcohol. She reports that she does not use drugs. She  reports that she currently engages in sexual activity and has had partner(s) who are Female. She reports using the following method of birth control/protection: Condom. The patient  has no past surgical history on file.  Her family history is not on file.  ROS per HPI  The problem list and medications were reviewed and updated by myself where necessary and exist elsewhere in the encounter.   OBJECTIVE:  BP 132/90 (BP Location: Right Arm, Patient Position: Sitting, Cuff Size: Normal)   Pulse 70   Temp 98.8 F (37.1 C) (Oral)   Resp 20   Wt 169 lb 8 oz (76.9 kg)   SpO2 97%   BMI 28.65 kg/m   Wt Readings from Last 3 Encounters:  04/07/18 169 lb 8 oz (76.9 kg)  02/26/18 173 lb 3.2 oz (78.6 kg)  11/19/17 176 lb 6.4 oz (80 kg)   Temp Readings from Last 3 Encounters:  04/07/18 98.8 F (37.1 C) (Oral)  02/26/18 98.4 F (36.9 C) (Oral)  11/19/17 98.6 F (37 C) (Oral)   BP Readings from Last 3 Encounters:  04/07/18 132/90  02/26/18 110/70  11/19/17 124/72   Pulse Readings from Last 3 Encounters:  04/07/18 70  02/26/18 68  11/19/17 68    Physical Exam  Constitutional: She is oriented to  person, place, and time.  Cardiovascular: Regular rhythm, S1 normal, S2 normal, normal heart sounds and intact distal pulses. Exam reveals no gallop, no friction rub and no decreased pulses.  No murmur heard. Pulmonary/Chest: She has no rales.  Abdominal: She exhibits no distension.  Musculoskeletal: She exhibits no edema.  Neurological: She is alert and oriented to person, place, and time. She displays normal reflexes. No cranial nerve deficit or sensory deficit. She exhibits normal muscle tone. Coordination normal.    No results found for: HGBA1C  Lab Results  Component Value Date   WBC 4.4 02/26/2018   HGB 12.7 02/26/2018   HCT 39.6 02/26/2018   MCV 85 02/26/2018   PLT 243 02/26/2018    Lab Results  Component Value Date   CREATININE 0.79 02/26/2018   BUN 11 02/26/2018   NA 142 02/26/2018   K 4.4 02/26/2018   CL 103 02/26/2018   CO2 22 02/26/2018    Lab Results  Component Value Date   ALT 9 02/26/2018   AST 14 02/26/2018   ALKPHOS 108 02/26/2018   BILITOT 0.6 02/26/2018    Lab Results  Component Value Date   TSH 1.080 02/26/2018    Lab Results  Component Value Date   CHOL 93 03/18/2014   HDL  64 03/18/2014   LDLCALC 21 03/18/2014   TRIG 39 03/18/2014   CHOLHDL 1.5 03/18/2014     ASSESSMENT AND PLAN:  Leah Clayton was seen today for headache, dizziness x1.  Diagnoses and all orders for this visit:  Nonintractable episodic headache, unspecified headache type Comments: Patient has a normal neurological exam at this time.  Her symptoms are resolving.  This may be migrainous in nature however dizziness is not a symptom typically encounter with migraine, she reports some gait disturbance during the episode today.  I see no indication that she needs to go to the ED.  I asked that she follow-up with her PCP to discuss for further discussion and options.  She may benefit from neurological referral and some advanced imaging.    The patient is advised to call or return  to clinic if she does not see an improvement in symptoms, or to seek the care of the closest emergency department if she worsens with the above plan.   Leah Clayton, MHS, PA-C [facility] 04/07/2018 6:22 PM

## 2018-04-08 ENCOUNTER — Encounter: Payer: Self-pay | Admitting: Internal Medicine

## 2018-04-16 ENCOUNTER — Ambulatory Visit (INDEPENDENT_AMBULATORY_CARE_PROVIDER_SITE_OTHER): Payer: No Typology Code available for payment source | Admitting: Nurse Practitioner

## 2018-04-16 ENCOUNTER — Encounter: Payer: Self-pay | Admitting: Nurse Practitioner

## 2018-04-16 VITALS — BP 110/70 | HR 66 | Temp 98.5°F | Ht 64.0 in | Wt 169.6 lb

## 2018-04-16 DIAGNOSIS — R42 Dizziness and giddiness: Secondary | ICD-10-CM | POA: Diagnosis not present

## 2018-04-16 NOTE — Progress Notes (Signed)
Subjective:     Patient ID: Leah Clayton , female    DOB: [birth year] , 22 y.o.   MRN: [id number]   Chief Complaint  Patient presents with  . Dizziness    patient wants a referral. patient states she has been having dizzy spells randomly  she stated she sneezed last week and suddenly got really dizzy like the room was spinning she stated she saw the nurse at work and her bp was 140/90. patient stated the last spell before that was a month ago.    HPI  She has been having intermittent dizzy spells since March 2019, occurring approximately 4 times.  She has been to urgent care.  She stopped Depo in July, she was being followed by GYN Dr. Mindi SlickerBanga - stopped due to weight gain.    She went to Urgent Care last Wednesday while at work she sneezed and became dizzy.  She walked to the bathroom and felt like she was stumbling. Then began to have a headache.  Blood pressure was elevated, given water and sent to Urgent Care.  Dizzy for 45 minutes to 1 hour.  Drinks at least 1 cup of water per day.  Denies loss of consciousness or bowel or bladder.    Dizziness  This is a recurrent problem. The current episode started more than 1 month ago. The problem occurs intermittently. Associated symptoms include headaches. Pertinent negatives include no abdominal pain, arthralgias, chest pain, congestion, coughing, fatigue, nausea, numbness, vomiting or weakness. The symptoms are aggravated by sneezing.     Past Medical History:  Diagnosis Date  . Asthma      No family history on file.   Current Outpatient Medications:  .  albuterol (PROAIR HFA) 108 (90 Base) MCG/ACT inhaler, ProAir HFA 90 mcg/actuation aerosol inhaler, Disp: , Rfl:  .  budesonide (RHINOCORT ALLERGY) 32 MCG/ACT nasal spray, Place 1 spray into both nostrils 2 (two) times daily., Disp: 1 Bottle, Rfl: 3 .  levocetirizine (XYZAL) 5 MG tablet, Take 1 tablet (5 mg total) by mouth every morning., Disp: 90 tablet, Rfl: 3 .  montelukast  (SINGULAIR) 10 MG tablet, Take 1 tablet (10 mg total) by mouth at bedtime., Disp: 90 tablet, Rfl: 3   Allergies  Allergen Reactions  . Shellfish Allergy Itching and Swelling     Review of Systems  Constitutional: Negative for fatigue.  HENT: Negative for congestion.   Eyes: Negative for photophobia.  Respiratory: Negative for cough, chest tightness, shortness of breath and wheezing.   Cardiovascular: Negative for chest pain, palpitations and leg swelling.  Gastrointestinal: Negative for abdominal pain, constipation, diarrhea, nausea and vomiting.  Endocrine: Negative for polydipsia, polyphagia and polyuria.  Musculoskeletal: Negative for arthralgias.  Neurological: Positive for dizziness and headaches. Negative for weakness and numbness.     Today's Vitals   04/16/18 0857  BP: 110/70  Pulse: 66  Temp: 98.5 F (36.9 C)  TempSrc: Oral  SpO2: 97%  Weight: 169 lb 9.6 oz (76.9 kg)  Height: 5\' 4"  (1.626 m)  PainSc: 0-No pain   Body mass index is 29.11 kg/m.   Objective:  Physical Exam  Constitutional: She is oriented to person, place, and time.  HENT:  Head: Normocephalic.  Right Ear: Hearing, tympanic membrane, external ear and ear canal normal.  Left Ear: Hearing, tympanic membrane, external ear and ear canal normal.  Eyes: Pupils are equal, round, and reactive to light. EOM are normal.  Cardiovascular: Normal rate, regular rhythm and normal heart sounds.  No  murmur heard. Pulmonary/Chest: Effort normal and breath sounds normal.  Neurological: She is alert and oriented to person, place, and time. She has normal strength. No cranial nerve deficit. She displays a negative Romberg sign. Gait abnormal. Coordination normal.  Skin: Skin is warm and dry.        Assessment And Plan:    1. Dizziness  Intermittent dizziness, most recent last Wednesday and seen at Urgent Care  Has occurred approximately 4 times since March  Orthostats are negative  LMP - 03/12/18  No  abnormal findings on physical exam  Will refer to Neurology for further evaluation  Encouraged to continue to take Singular daily - Ambulatory referral to Neurology     Arnette Felts, FNP

## 2018-04-16 NOTE — Patient Instructions (Signed)

## 2019-03-02 ENCOUNTER — Encounter: Payer: No Typology Code available for payment source | Admitting: Nurse Practitioner

## 2019-03-02 ENCOUNTER — Encounter: Payer: No Typology Code available for payment source | Admitting: Internal Medicine

## 2019-03-04 ENCOUNTER — Encounter: Payer: No Typology Code available for payment source | Admitting: Nurse Practitioner

## 2019-03-24 ENCOUNTER — Encounter: Payer: No Typology Code available for payment source | Admitting: Nurse Practitioner

## 2019-04-15 ENCOUNTER — Ambulatory Visit
Admission: EM | Admit: 2019-04-15 | Discharge: 2019-04-15 | Disposition: A | Discharge status: Home / Self Care | Payer: No Typology Code available for payment source | Attending: Physician Assistant | Admitting: Physician Assistant

## 2019-04-15 ENCOUNTER — Other Ambulatory Visit: Payer: Self-pay

## 2019-04-15 DIAGNOSIS — Z20822 Contact with and (suspected) exposure to covid-19: Secondary | ICD-10-CM

## 2019-04-15 DIAGNOSIS — J029 Acute pharyngitis, unspecified: Secondary | ICD-10-CM | POA: Diagnosis not present

## 2019-04-15 DIAGNOSIS — Z20828 Contact with and (suspected) exposure to other viral communicable diseases: Secondary | ICD-10-CM

## 2019-04-15 DIAGNOSIS — R52 Pain, unspecified: Secondary | ICD-10-CM

## 2019-04-15 NOTE — ED Triage Notes (Signed)
Pt states exposed to positive COVID by her uncle on thanksgiving 11/26. States has had a back pain since Monday and a sore throat today. States needs testing for work.

## 2019-04-15 NOTE — Discharge Instructions (Signed)
COVID testing ordered. I would like you to quarantine until testing results. You can take over the counter flonase/nasacort to help with nasal congestion/drainage. If experiencing shortness of breath, trouble breathing, go to the emergency department for further evaluation needed. °

## 2019-04-15 NOTE — ED Provider Notes (Signed)
c [facility]    CSN: [id number] Arrival date & time: 04/15/19  1721      History   Chief Complaint Chief Complaint  Patient presents with  . COVID exposure on 04/08/19    HPI Leah Clayton is a 23 y.o. female.   23 year old female come in for COVID testing. States she was exposed to her uncle 04/08/2019 for about 1 hour without mask on. She works in [employer], states health at work does not require her to be tested. States she has had body aches and sore throat for the past 2 days. Currently sore throat has resolved. Denies rhinorrhea, nasal congestion, cough. Denies fever, chills. Has had body aches. Denies abdominal pain, nausea, vomiting, diarrhea. Denies shortness of breath, loss of taste/smell.      Past Medical History:  Diagnosis Date  . Asthma     Patient Active Problem List   Diagnosis Date Noted  . Dizziness 04/16/2018  . LGSIL on Pap smear of cervix 11/22/2017  . Sprain of right knee 11/19/2017    History reviewed. No pertinent surgical history.  OB History   No obstetric history on file.      Home Medications    Prior to Admission medications   Medication Sig Start Date End Date Taking? Authorizing Provider  albuterol (PROAIR HFA) 108 (90 Base) MCG/ACT inhaler ProAir HFA 90 mcg/actuation aerosol inhaler    [provider]  budesonide (RHINOCORT ALLERGY) 32 MCG/ACT nasal spray Place 1 spray into both nostrils 2 (two) times daily. 07/18/17   Barnet Glasgow, NP  levocetirizine (XYZAL) 5 MG tablet Take 1 tablet (5 mg total) by mouth every morning. 07/18/17   Barnet Glasgow, NP  montelukast (SINGULAIR) 10 MG tablet Take 1 tablet (10 mg total) by mouth at bedtime. 07/18/17   Barnet Glasgow, NP    Family History History reviewed. No pertinent family history.  Social History Social History   Tobacco Use  . Smoking status: Never Smoker  . Smokeless tobacco: Never Used  Substance Use Topics  . Alcohol use: Yes   Comment: Occasional   . Drug use: Never     Allergies   Shellfish allergy   Review of Systems Review of Systems  Reason unable to perform ROS: See HPI as above.     Physical Exam Triage Vital Signs ED Triage Vitals  Enc Vitals Group     BP 04/15/19 1729 (!) 154/95     Pulse Rate 04/15/19 1729 93     Resp 04/15/19 1729 16     Temp 04/15/19 1729 98.5 F (36.9 C)     Temp Source 04/15/19 1729 Oral     SpO2 04/15/19 1729 97 %     Weight --      Height --      Head Circumference --      Peak Flow --      Pain Score 04/15/19 1730 6     Pain Loc --      Pain Edu? --      Excl. in [place]? --    No data found.  Updated Vital Signs BP (!) 154/95 (BP Location: Left Arm)   Pulse 93   Temp 98.5 F (36.9 C) (Oral)   Resp 16   LMP 02/02/2019   SpO2 97%   Physical Exam Constitutional:      General: She is not in acute distress.    Appearance: Normal appearance. She is not ill-appearing, toxic-appearing or diaphoretic.  HENT:  Head: Normocephalic and atraumatic.     Nose:     Right Sinus: No maxillary sinus tenderness or frontal sinus tenderness.     Left Sinus: No maxillary sinus tenderness or frontal sinus tenderness.     Mouth/Throat:     Mouth: Mucous membranes are moist.     Pharynx: Oropharynx is clear. Uvula midline.  Neck:     Musculoskeletal: Normal range of motion and neck supple.  Cardiovascular:     Rate and Rhythm: Normal rate and regular rhythm.     Heart sounds: Normal heart sounds. No murmur. No friction rub. No gallop.   Pulmonary:     Effort: Pulmonary effort is normal. No accessory muscle usage, prolonged expiration, respiratory distress or retractions.     Comments: Lungs clear to auscultation without adventitious lung sounds. Skin:    General: Skin is warm and dry.  Neurological:     General: No focal deficit present.     Mental Status: She is alert and oriented to person, place, and time.    UC Treatments / Results  Labs (all labs ordered  are listed, but only abnormal results are displayed) Labs Reviewed  NOVEL CORONAVIRUS, NAA    EKG   Radiology No results found.  Procedures Procedures (including critical care time)  Medications Ordered in UC Medications - No data to display  Initial Impression / Assessment and Plan / UC Course  I have reviewed the triage vital signs and the nursing notes.  Pertinent labs & imaging results that were available during my care of the patient were reviewed by me and considered in my medical decision making (see chart for details).    COVID testing ordered. Patient to quarantine until testing results return. Discussed if test is negative, may still be during incubation period given positive  Contact and will have to continue to monitor. No alarming signs on exam at this time.  Patient speaking in full sentences without respiratory distress.  Symptomatic treatment discussed.  Push fluids. Return precautions given.  Patient expresses understanding and agrees to plan.  Final Clinical Impressions(s) / UC Diagnoses   Final diagnoses:  Exposure to COVID-19 virus  Sore throat  Body aches   ED Prescriptions    None     PDMP not reviewed this encounter.   Belinda Fisher, PA-C 04/15/19 1754

## 2019-04-18 ENCOUNTER — Encounter (HOSPITAL_COMMUNITY): Payer: Self-pay

## 2019-04-18 ENCOUNTER — Ambulatory Visit (HOSPITAL_COMMUNITY)
Admission: EM | Admit: 2019-04-18 | Discharge: 2019-04-18 | Disposition: A | Discharge status: Home / Self Care | Payer: No Typology Code available for payment source | Attending: Urgent Care | Admitting: Urgent Care

## 2019-04-18 ENCOUNTER — Other Ambulatory Visit: Payer: Self-pay

## 2019-04-18 DIAGNOSIS — M545 Low back pain, unspecified: Secondary | ICD-10-CM

## 2019-04-18 DIAGNOSIS — R202 Paresthesia of skin: Secondary | ICD-10-CM

## 2019-04-18 DIAGNOSIS — M25522 Pain in left elbow: Secondary | ICD-10-CM

## 2019-04-18 MED ORDER — CYCLOBENZAPRINE HCL 5 MG PO TABS: 5.0000 mg | ORAL_TABLET | Freq: Every evening | ORAL | 0 refills | Status: DC | PRN

## 2019-04-18 MED ORDER — CYCLOBENZAPRINE HCL 5 MG PO TABS: 5.0000 mg | ORAL_TABLET | Freq: Every evening | ORAL | 0 refills | Status: AC | PRN

## 2019-04-18 MED ORDER — NAPROXEN 500 MG PO TABS: 500.0000 mg | ORAL_TABLET | Freq: Two times a day (BID) | ORAL | 0 refills | Status: DC

## 2019-04-18 MED ORDER — NAPROXEN 500 MG PO TABS: 500.0000 mg | ORAL_TABLET | Freq: Two times a day (BID) | ORAL | 0 refills | Status: AC

## 2019-04-18 NOTE — Discharge Instructions (Signed)
Hydrate well with at least 2 liters (64 ounces) of water daily.  

## 2019-04-18 NOTE — ED Triage Notes (Signed)
Pt states earlier today her left elbow has numbness and tingling elbow to her wrist.  Shooting pain in her elbow and arm off and on for about  20 minutes.pt states she was tested for Covid on Thursday and she is waiting on results.

## 2019-04-18 NOTE — ED Provider Notes (Signed)
[facility]   MRN: [id number] DOB: 04/29/1996  Subjective:   Leah Clayton is a 23 y.o. female presenting for acute onset this morning of tingling of her left elbow that radiates through her forearm with associated mild discomfort.  Patient actively denies that it is pain but an intermittent tingling sensation.  She also has low back aches and a mild headache.  Patient was tested for Covid 19 on 04/15/2019 and results are pending.  She has not had any kind of respiratory symptoms.  She has not tried medications for relief today.  Patient does not exercise, denies any kind of falls or trauma.  She admits that she does not hydrate.  No current facility-administered medications for this encounter.   Current Outpatient Medications:  .  albuterol (PROAIR HFA) 108 (90 Base) MCG/ACT inhaler, ProAir HFA 90 mcg/actuation aerosol inhaler, Disp: , Rfl:  .  budesonide (RHINOCORT ALLERGY) 32 MCG/ACT nasal spray, Place 1 spray into both nostrils 2 (two) times daily., Disp: 1 Bottle, Rfl: 3 .  levocetirizine (XYZAL) 5 MG tablet, Take 1 tablet (5 mg total) by mouth every morning., Disp: 90 tablet, Rfl: 3 .  montelukast (SINGULAIR) 10 MG tablet, Take 1 tablet (10 mg total) by mouth at bedtime., Disp: 90 tablet, Rfl: 3   Allergies  Allergen Reactions  . Shellfish Allergy Itching and Swelling    Past Medical History:  Diagnosis Date  . Asthma      History reviewed. No pertinent surgical history.  History reviewed. No pertinent family history.  Social History   Tobacco Use  . Smoking status: Never Smoker  . Smokeless tobacco: Never Used  Substance Use Topics  . Alcohol use: Yes    Comment: Occasional   . Drug use: Never    Review of Systems  Constitutional: Negative for fever and malaise/fatigue.  HENT: Negative for congestion, ear pain, sinus pain and sore throat.   Eyes: Negative for discharge and redness.  Respiratory: Negative for cough, hemoptysis, shortness of breath and  wheezing.   Cardiovascular: Negative for chest pain.  Gastrointestinal: Negative for abdominal pain, diarrhea, nausea and vomiting.  Genitourinary: Negative for dysuria, flank pain and hematuria.  Musculoskeletal: Negative for myalgias.  Skin: Negative for rash.  Neurological: Positive for tingling. Negative for dizziness, weakness and headaches.  Psychiatric/Behavioral: Negative for depression and substance abuse.     Objective:   Vitals: BP (!) 144/81 (BP Location: Right Arm)   Pulse (!) 105   Temp 99.1 F (37.3 C) (Oral)   Resp 18   LMP 04/11/2019   SpO2 100%   Pulse 80 on recheck by PA [facility].  Physical Exam Constitutional:      General: She is not in acute distress.    Appearance: Normal appearance. She is well-developed. She is not ill-appearing, toxic-appearing or diaphoretic.  HENT:     Head: Normocephalic and atraumatic.     Nose: Nose normal.     Mouth/Throat:     Mouth: Mucous membranes are moist.  Eyes:     Extraocular Movements: Extraocular movements intact.     Pupils: Pupils are equal, round, and reactive to light.  Cardiovascular:     Rate and Rhythm: Normal rate and regular rhythm.     Pulses: Normal pulses.     Heart sounds: Normal heart sounds. No murmur. No friction rub. No gallop.   Pulmonary:     Effort: Pulmonary effort is normal. No respiratory distress.     Breath sounds: Normal breath sounds. No  stridor. No wheezing, rhonchi or rales.  Musculoskeletal:     Left elbow: She exhibits normal range of motion, no swelling, no effusion, no deformity and no laceration. No tenderness found.     Left wrist: She exhibits normal range of motion, no tenderness, no bony tenderness, no swelling, no effusion, no crepitus and no deformity.     Lumbar back: She exhibits tenderness (mild over area outlined) and spasm. She exhibits normal range of motion, no bony tenderness, no swelling, no edema, no deformity and no pain.       Back:     Left forearm: She  exhibits no tenderness, no bony tenderness, no swelling, no edema and no deformity.     Left hand: She exhibits normal range of motion, no tenderness, no bony tenderness, normal capillary refill, no deformity and no swelling. Normal sensation noted. Normal strength noted.  Skin:    General: Skin is warm and dry.     Findings: No rash.  Neurological:     Mental Status: She is alert and oriented to person, place, and time.  Psychiatric:        Mood and Affect: Mood normal.        Behavior: Behavior normal.        Thought Content: Thought content normal.      Assessment and Plan :   1. Acute low back pain without sciatica, unspecified back pain laterality   2. Left elbow pain   3. Tingling     Symptoms not reproducible on exam.  Physical exam findings very reassuring.  Will manage conservatively for musculoskeletal pain with NSAID and muscle relaxant, rest and modification of physical activity.  Emphasized need to start hydrating well with water.  Anticipatory guidance provided.  Counseled patient on potential for adverse effects with medications prescribed/recommended today, ER and return-to-clinic precautions discussed, patient verbalized understanding.    Jaynee Eagles, [place] 04/21/19 [phone]

## 2019-04-19 LAB — NOVEL CORONAVIRUS, NAA: SARS-CoV-2, NAA: NOT DETECTED

## 2022-07-04 ENCOUNTER — Encounter (HOSPITAL_COMMUNITY): Payer: Self-pay

## 2022-07-04 ENCOUNTER — Ambulatory Visit (HOSPITAL_COMMUNITY)
Admission: EM | Admit: 2022-07-04 | Discharge: 2022-07-04 | Disposition: A | Discharge status: Home / Self Care | Payer: BC Managed Care – PPO | Attending: Internal Medicine | Admitting: Internal Medicine

## 2022-07-04 DIAGNOSIS — J302 Other seasonal allergic rhinitis: Secondary | ICD-10-CM | POA: Insufficient documentation

## 2022-07-04 DIAGNOSIS — R42 Dizziness and giddiness: Secondary | ICD-10-CM | POA: Diagnosis present

## 2022-07-04 DIAGNOSIS — Z79899 Other long term (current) drug therapy: Secondary | ICD-10-CM | POA: Diagnosis not present

## 2022-07-04 LAB — CBC WITH DIFFERENTIAL/PLATELET
Abs Immature Granulocytes: 0.01 10*3/uL (ref 0.00–0.07)
Basophils Absolute: 0.1 10*3/uL (ref 0.0–0.1)
Basophils Relative: 1 %
Eosinophils Absolute: 0.2 10*3/uL (ref 0.0–0.5)
Eosinophils Relative: 5 %
HCT: 37.4 % (ref 36.0–46.0)
Hemoglobin: 12.4 g/dL (ref 12.0–15.0)
Immature Granulocytes: 0 %
Lymphocytes Relative: 35 %
Lymphs Abs: 1.4 10*3/uL (ref 0.7–4.0)
MCH: 29 pg (ref 26.0–34.0)
MCHC: 33.2 g/dL (ref 30.0–36.0)
MCV: 87.4 fL (ref 80.0–100.0)
Monocytes Absolute: 0.3 10*3/uL (ref 0.1–1.0)
Monocytes Relative: 7 %
Neutro Abs: 2 10*3/uL (ref 1.7–7.7)
Neutrophils Relative %: 52 %
Platelets: 247 10*3/uL (ref 150–400)
RBC: 4.28 MIL/uL (ref 3.87–5.11)
RDW: 12.9 % (ref 11.5–15.5)
WBC: 3.9 10*3/uL — ABNORMAL LOW (ref 4.0–10.5)
nRBC: 0 % (ref 0.0–0.2)

## 2022-07-04 LAB — BASIC METABOLIC PANEL
Anion gap: 6 (ref 5–15)
BUN: 13 mg/dL (ref 6–20)
CO2: 23 mmol/L (ref 22–32)
Calcium: 8.6 mg/dL — ABNORMAL LOW (ref 8.9–10.3)
Chloride: 107 mmol/L (ref 98–111)
Creatinine, Ser: 0.87 mg/dL (ref 0.44–1.00)
GFR, Estimated: >60 mL/min (ref >60)
Glucose, Bld: 94 mg/dL (ref 70–99)
Potassium: 4.3 mmol/L (ref 3.5–5.1)
Sodium: 136 mmol/L (ref 135–145)

## 2022-07-04 LAB — TSH: TSH: 1.006 u[IU]/mL (ref 0.350–4.500)

## 2022-07-04 LAB — VITAMIN D 25 HYDROXY (VIT D DEFICIENCY, FRACTURES): Vit D, 25-Hydroxy: 30.84 ng/mL (ref 30–100)

## 2022-07-04 MED ORDER — MECLIZINE HCL 25 MG PO TABS: 25.0000 mg | ORAL_TABLET | Freq: Three times a day (TID) | ORAL | 0 refills | Status: AC | PRN

## 2022-07-04 MED ORDER — MONTELUKAST SODIUM 10 MG PO TABS: 10.0000 mg | ORAL_TABLET | Freq: Every day | ORAL | 3 refills | Status: AC

## 2022-07-04 NOTE — ED Triage Notes (Signed)
Pt states had a dizzy spell while driving this morning and felt like she was going to pass out and had pressure behind her eyes feeling weak, lasted 1 hr. States hx of same but usually don't last this long.

## 2022-07-04 NOTE — Discharge Instructions (Addendum)
Please continue using humidifier and your nasal spray. I will recommend you switch from Xyzal to Singulair. Your EKG is reassuring Maintain adequate hydration Will call you with recommendations if labs are abnormal Return to urgent care if you have persistent or recurrent symptoms.

## 2022-07-04 NOTE — ED Provider Notes (Signed)
[place]    CSN: [id number] Arrival date & time: 07/04/22  0808      History   Chief Complaint Chief Complaint  Patient presents with   Dizziness    HPI Leah Clayton is a 27 y.o. female comes to the urgent care with sudden onset dizzy spell which happened this morning.  Patient was driving when she had sudden onset dizzy spell.  According to the patient she felt as if she was spinning.  She denies any sensation of the environment spinning around her.  The dizziness was associated with pressure behind her eyes and generalized weakness with some anxiety.  Symptoms lasted for an hour and resolved spontaneously.  Patient endorses that she has had this episode a few times in the recent past.  She denies any nausea or vomiting or diarrhea.  No sore throat or fever.  She denies any ear fullness or ringing in the ears.  No muffled hearing or difficulty hearing out of both ears.  No ear discharge.  Patient has a history of asthma and seasonal allergies.  She is currently being managed with Xyzal.  No recent falls or trauma to the head.  HPI  Past Medical History:  Diagnosis Date   Asthma     Patient Active Problem List   Diagnosis Date Noted   Dizziness 04/16/2018   LGSIL on Pap smear of cervix 11/22/2017   Sprain of right knee 11/19/2017    History reviewed. No pertinent surgical history.  OB History   No obstetric history on file.      Home Medications    Prior to Admission medications   Medication Sig Start Date End Date Taking? Authorizing Provider  meclizine (ANTIVERT) 25 MG tablet Take 1 tablet (25 mg total) by mouth 3 (three) times daily as needed for dizziness. 07/04/22  Yes Jenaveve Fenstermaker, Myrene Galas, MD  albuterol (PROAIR HFA) 108 (90 Base) MCG/ACT inhaler ProAir HFA 90 mcg/actuation aerosol inhaler    [provider]  budesonide (RHINOCORT ALLERGY) 32 MCG/ACT nasal spray Place 1 spray into both nostrils 2 (two) times daily. 07/18/17   Barnet Glasgow, NP  cyclobenzaprine (FLEXERIL) 5 MG tablet Take 1 tablet (5 mg total) by mouth at bedtime as needed for muscle spasms. 04/18/19   Jaynee Eagles, PA-C  levocetirizine (XYZAL) 5 MG tablet Take 1 tablet (5 mg total) by mouth every morning. 07/18/17   Barnet Glasgow, NP  montelukast (SINGULAIR) 10 MG tablet Take 1 tablet (10 mg total) by mouth at bedtime. 07/04/22   Shetara Launer, Myrene Galas, MD  naproxen (NAPROSYN) 500 MG tablet Take 1 tablet (500 mg total) by mouth 2 (two) times daily. 04/18/19   Jaynee Eagles, PA-C    Family History Family History  Problem Relation Age of Onset   Healthy Mother     Social History Social History   Tobacco Use   Smoking status: Never   Smokeless tobacco: Never  Substance Use Topics   Alcohol use: Yes    Comment: Occasional    Drug use: Never     Allergies   Shellfish allergy   Review of Systems Review of Systems  Constitutional: Negative.   HENT: Negative.  Negative for postnasal drip, sinus pressure, sinus pain, sneezing, sore throat, tinnitus and voice change.   Respiratory: Negative.    Cardiovascular: Negative.      Physical Exam Triage Vital Signs ED Triage Vitals  Enc Vitals Group     BP 07/04/22 0846 126/78  Pulse Rate 07/04/22 0846 72     Resp 07/04/22 0846 18     Temp 07/04/22 0846 98.6 F (37 C)     Temp Source 07/04/22 0846 Oral     SpO2 07/04/22 0846 100 %     Weight --      Height --      Head Circumference --      Peak Flow --      Pain Score 07/04/22 0847 0     Pain Loc --      Pain Edu? --      Excl. in [place]? --    No data found.  Updated Vital Signs BP 126/78 (BP Location: Right Arm)   Pulse 72   Temp 98.6 F (37 C) (Oral)   Resp 18   LMP 07/01/2022   SpO2 100%   Visual Acuity Right Eye Distance:   Left Eye Distance:   Bilateral Distance:    Right Eye Near:   Left Eye Near:    Bilateral Near:     Physical Exam Vitals and nursing note reviewed.  Constitutional:      Appearance: Normal  appearance.  HENT:     Mouth/Throat:     Mouth: Mucous membranes are moist.     Comments: Bilateral middle ear effusion Cardiovascular:     Rate and Rhythm: Normal rate and regular rhythm.     Pulses: Normal pulses.     Heart sounds: Normal heart sounds.  Pulmonary:     Effort: Pulmonary effort is normal.     Breath sounds: Normal breath sounds.  Abdominal:     General: Bowel sounds are normal.     Palpations: Abdomen is soft.  Musculoskeletal:     Cervical back: Neck supple.  Neurological:     Mental Status: She is alert.      UC Treatments / Results  Labs (all labs ordered are listed, but only abnormal results are displayed) Labs Reviewed  CBC WITH DIFFERENTIAL/PLATELET  BASIC METABOLIC PANEL  TSH  VITAMIN D 25 HYDROXY (VIT D DEFICIENCY, FRACTURES)    EKG   Radiology No results found.  Procedures Procedures (including critical care time)  Medications Ordered in UC Medications - No data to display  Initial Impression / Assessment and Plan / UC Course  I have reviewed the triage vital signs and the nursing notes.  Pertinent labs & imaging results that were available during my care of the patient were reviewed by me and considered in my medical decision making (see chart for details).     1.  Dizziness and vertigo: EKG shows normal sinus rhythm CBC, BMP, TSH and vitamin D level Meclizine as needed for vertigo Discontinue Xyzal and start Singulair Maintain adequate hydration Return to urgent care if symptoms worsen. Final Clinical Impressions(s) / UC Diagnoses   Final diagnoses:  Vertigo     Discharge Instructions      Please continue using humidifier and your nasal spray. I will recommend you switch from Xyzal to Singulair. Your EKG is reassuring Maintain adequate hydration Will call you with recommendations if labs are abnormal Return to urgent care if you have persistent or recurrent symptoms.   ED Prescriptions     Medication Sig  Dispense Auth. Provider   montelukast (SINGULAIR) 10 MG tablet Take 1 tablet (10 mg total) by mouth at bedtime. 90 tablet Montoya Brandel, Myrene Galas, MD   meclizine (ANTIVERT) 25 MG tablet Take 1 tablet (25 mg total) by mouth 3 (three) times daily  as needed for dizziness. 30 tablet Rashia Mckesson, Myrene Galas, MD      PDMP not reviewed this encounter.   Chase Picket, MD 07/04/22 1226

## 2022-07-04 NOTE — ED Notes (Signed)
Labs being drawn 

## 2023-02-12 ENCOUNTER — Encounter (HOSPITAL_BASED_OUTPATIENT_CLINIC_OR_DEPARTMENT_OTHER): Payer: Self-pay

## 2023-02-12 ENCOUNTER — Emergency Department (HOSPITAL_BASED_OUTPATIENT_CLINIC_OR_DEPARTMENT_OTHER)
Admission: EM | Admit: 2023-02-12 | Discharge: 2023-02-12 | Disposition: A | Discharge status: Home / Self Care | Payer: BLUE CROSS/BLUE SHIELD | Attending: Emergency Medicine | Admitting: Emergency Medicine

## 2023-02-12 ENCOUNTER — Other Ambulatory Visit: Payer: Self-pay

## 2023-02-12 ENCOUNTER — Emergency Department (HOSPITAL_BASED_OUTPATIENT_CLINIC_OR_DEPARTMENT_OTHER): Payer: BLUE CROSS/BLUE SHIELD

## 2023-02-12 DIAGNOSIS — S61212A Laceration without foreign body of right middle finger without damage to nail, initial encounter: Secondary | ICD-10-CM | POA: Insufficient documentation

## 2023-02-12 DIAGNOSIS — S6991XA Unspecified injury of right wrist, hand and finger(s), initial encounter: Secondary | ICD-10-CM | POA: Diagnosis present

## 2023-02-12 DIAGNOSIS — W25XXXA Contact with sharp glass, initial encounter: Secondary | ICD-10-CM | POA: Insufficient documentation

## 2023-02-12 DIAGNOSIS — Z23 Encounter for immunization: Secondary | ICD-10-CM | POA: Insufficient documentation

## 2023-02-12 DIAGNOSIS — J45909 Unspecified asthma, uncomplicated: Secondary | ICD-10-CM | POA: Insufficient documentation

## 2023-02-12 MED ORDER — TETANUS-DIPHTH-ACELL PERTUSSIS 5-2.5-18.5 LF-MCG/0.5 IM SUSY
0.5000 mL | PREFILLED_SYRINGE | Freq: Once | INTRAMUSCULAR | Status: AC
  Administered 2023-02-12: 0.5 mL via INTRAMUSCULAR
  Filled 2023-02-12: qty 0.5

## 2023-02-12 NOTE — ED Notes (Signed)
EDP in room prior to completion of triage. Waiver not signed

## 2023-02-12 NOTE — ED Provider Notes (Signed)
[place] [facility] Provider Note   CSN: [id number] Arrival date & time: 02/12/23  5621     History  Chief Complaint  Patient presents with   Laceration    Leah Clayton is a 27 y.o. female.  27 year old right-handed female with a history of asthma who presents to the emergency department with finger laceration.  Reports that yesterday at 4 PM child went to open a fire extinguisher door and she put her hand out to close the door.  Says that a piece of glass cut her right middle finger.  Has been having some persistent bleeding since then so decided to come into the emergency department for evaluation.  Glass did not break.  Unsure of last tetanus shot date.       Home Medications Prior to Admission medications   Medication Sig Start Date End Date Taking? Authorizing Provider  albuterol (PROAIR HFA) 108 (90 Base) MCG/ACT inhaler ProAir HFA 90 mcg/actuation aerosol inhaler    [provider]  budesonide (RHINOCORT ALLERGY) 32 MCG/ACT nasal spray Place 1 spray into both nostrils 2 (two) times daily. 07/18/17   Dorena Bodo, NP  cyclobenzaprine (FLEXERIL) 5 MG tablet Take 1 tablet (5 mg total) by mouth at bedtime as needed for muscle spasms. 04/18/19   Wallis Bamberg, PA-C  levocetirizine (XYZAL) 5 MG tablet Take 1 tablet (5 mg total) by mouth every morning. 07/18/17   Dorena Bodo, NP  meclizine (ANTIVERT) 25 MG tablet Take 1 tablet (25 mg total) by mouth 3 (three) times daily as needed for dizziness. 07/04/22   Lamptey, Britta Mccreedy, MD  montelukast (SINGULAIR) 10 MG tablet Take 1 tablet (10 mg total) by mouth at bedtime. 07/04/22   Lamptey, Britta Mccreedy, MD  naproxen (NAPROSYN) 500 MG tablet Take 1 tablet (500 mg total) by mouth 2 (two) times daily. 04/18/19   Wallis Bamberg, PA-C      Allergies    Shellfish allergy    Review of Systems   Review of Systems  Physical Exam Updated Vital Signs BP 108/74   Pulse (!) 56   Temp 98.6 F (37  C)   Resp 16   LMP 02/08/2023 (Exact Date)   SpO2 96%  Physical Exam HENT:     Head: Normocephalic and atraumatic.  Musculoskeletal:        General: No deformity.     Comments: Right index finger with 1 cm x 3 mm laceration that is superficial.  No active bleeding.     ED Results / Procedures / Treatments   Labs (all labs ordered are listed, but only abnormal results are displayed) Labs Reviewed - No data to display  EKG None  Radiology DG Finger Middle Right  Result Date: 02/12/2023 CLINICAL DATA:  Laceration from glass EXAM: RIGHT MIDDLE FINGER 2+V COMPARISON:  None Available. FINDINGS: There is no evidence of fracture or dislocation. There is no evidence of arthropathy or other focal bone abnormality. Soft tissues are unremarkable. IMPRESSION: Negative. Electronically Signed   By: Judie Petit.  Shick M.D.   On: 02/12/2023 09:17    Procedures Procedures    Medications Ordered in ED Medications  Tdap (BOOSTRIX) injection 0.5 mL (0.5 mLs Intramuscular Given 02/12/23 0847)    ED Course/ Medical Decision Making/ A&P                                 Medical Decision Making Amount and/or Complexity  of Data Reviewed Radiology: ordered.  Risk Prescription drug management.   Leah Clayton is a 27 y.o. right-handed female with comorbidities that complicate the patient evaluation including history of asthma who presents emergency department with right middle finger laceration  Initial Ddx:  Laceration, foreign body, infection  MDM/Course:  Patient presented to the emergency department with laceration of her right middle finger after some glass seems to have cut it.  Had x-rays which did not show evidence of radiopaque foreign body.  Laceration is very small and shallow so is not amenable to closure at this time.  Tetanus shot was updated.  Instructed to keep bandages on the wound to prevent any bleeding or infection.   This patient presents to the ED for concern of complaints  listed in HPI, this involves an extensive number of treatment options, and is a complaint that carries with it a high risk of complications and morbidity. Disposition including potential need for admission considered.   Dispo: DC Home. Return precautions discussed including, but not limited to, those listed in the AVS. Allowed pt time to ask questions which were answered fully prior to dc.  Records reviewed Outpatient Clinic Notes I independently reviewed the following imaging with scope of interpretation limited to determining acute life threatening conditions related to emergency care:  Finger x-ray  and agree with the radiologist interpretation with the following exceptions: none I personally reviewed and interpreted cardiac monitoring: normal sinus rhythm  I personally reviewed and interpreted the pt's EKG: see above for interpretation  I have reviewed the patients home medications and made adjustments as needed  Portions of this note were generated with Dragon dictation software. Dictation errors may occur despite best attempts at proofreading.           Final Clinical Impression(s) / ED Diagnoses Final diagnoses:  Laceration of right middle finger without foreign body without damage to nail, initial encounter    Rx / DC Orders ED Discharge Orders     None         Rondel Baton, MD 02/12/23 [phone]

## 2023-02-12 NOTE — Discharge Instructions (Signed)
You were seen for the cut on your finger in the emergency department.   At home, please Tylenol and ibuprofen for pain.  Please use Band-Aids to prevent worsening bleeding or infection.    Check your MyChart online for the results of any tests that had not resulted by the time you left the emergency department.   Follow-up with your primary doctor in 2-3 days regarding your visit.    Return immediately to the emergency department if you experience any of the following: Worsening pain, or any other concerning symptoms.    Thank you for visiting our Emergency Department. It was a pleasure taking care of you today.

## 2023-02-12 NOTE — ED Triage Notes (Signed)
Pt presents with complaint of laceration to right middle finger. 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 Shut her finger in door

## 2023-03-29 ENCOUNTER — Encounter (HOSPITAL_BASED_OUTPATIENT_CLINIC_OR_DEPARTMENT_OTHER): Payer: Self-pay

## 2023-03-29 ENCOUNTER — Emergency Department (HOSPITAL_BASED_OUTPATIENT_CLINIC_OR_DEPARTMENT_OTHER)
Admission: EM | Admit: 2023-03-29 | Discharge: 2023-03-30 | Disposition: A | Discharge status: Home / Self Care | Payer: BLUE CROSS/BLUE SHIELD | Attending: Emergency Medicine | Admitting: Emergency Medicine

## 2023-03-29 ENCOUNTER — Emergency Department (HOSPITAL_BASED_OUTPATIENT_CLINIC_OR_DEPARTMENT_OTHER): Payer: BLUE CROSS/BLUE SHIELD

## 2023-03-29 ENCOUNTER — Other Ambulatory Visit: Payer: Self-pay

## 2023-03-29 DIAGNOSIS — J45909 Unspecified asthma, uncomplicated: Secondary | ICD-10-CM | POA: Diagnosis not present

## 2023-03-29 DIAGNOSIS — G43819 Other migraine, intractable, without status migrainosus: Secondary | ICD-10-CM | POA: Insufficient documentation

## 2023-03-29 DIAGNOSIS — R42 Dizziness and giddiness: Secondary | ICD-10-CM

## 2023-03-29 DIAGNOSIS — H538 Other visual disturbances: Secondary | ICD-10-CM | POA: Diagnosis not present

## 2023-03-29 HISTORY — DX: Dizziness and giddiness: R42

## 2023-03-29 LAB — URINALYSIS, ROUTINE W REFLEX MICROSCOPIC
Bilirubin Urine: NEGATIVE
Glucose, UA: NEGATIVE mg/dL
Hgb urine dipstick: NEGATIVE
Ketones, ur: NEGATIVE mg/dL
Leukocytes,Ua: NEGATIVE
Nitrite: NEGATIVE
Protein, ur: NEGATIVE mg/dL
Specific Gravity, Urine: 1.02 (ref 1.005–1.030)
pH: 7.5 (ref 5.0–8.0)

## 2023-03-29 LAB — CBC WITH DIFFERENTIAL/PLATELET
Abs Immature Granulocytes: 0.01 10*3/uL (ref 0.00–0.07)
Basophils Absolute: 0 10*3/uL (ref 0.0–0.1)
Basophils Relative: 1 %
Eosinophils Absolute: 0.1 10*3/uL (ref 0.0–0.5)
Eosinophils Relative: 2 %
HCT: 37.1 % (ref 36.0–46.0)
Hemoglobin: 12.3 g/dL (ref 12.0–15.0)
Immature Granulocytes: 0 %
Lymphocytes Relative: 31 %
Lymphs Abs: 2.2 10*3/uL (ref 0.7–4.0)
MCH: 28.5 pg (ref 26.0–34.0)
MCHC: 33.2 g/dL (ref 30.0–36.0)
MCV: 86.1 fL (ref 80.0–100.0)
Monocytes Absolute: 0.4 10*3/uL (ref 0.1–1.0)
Monocytes Relative: 6 %
Neutro Abs: 4.3 10*3/uL (ref 1.7–7.7)
Neutrophils Relative %: 60 %
Platelets: 328 10*3/uL (ref 150–400)
RBC: 4.31 MIL/uL (ref 3.87–5.11)
RDW: 12.5 % (ref 11.5–15.5)
WBC: 7.1 10*3/uL (ref 4.0–10.5)
nRBC: 0 % (ref 0.0–0.2)

## 2023-03-29 LAB — BASIC METABOLIC PANEL
Anion gap: 7 (ref 5–15)
BUN: 16 mg/dL (ref 6–20)
CO2: 25 mmol/L (ref 22–32)
Calcium: 8.7 mg/dL — ABNORMAL LOW (ref 8.9–10.3)
Chloride: 105 mmol/L (ref 98–111)
Creatinine, Ser: 0.93 mg/dL (ref 0.44–1.00)
GFR, Estimated: >60 mL/min (ref >60)
Glucose, Bld: 112 mg/dL — ABNORMAL HIGH (ref 70–99)
Potassium: 3.4 mmol/L — ABNORMAL LOW (ref 3.5–5.1)
Sodium: 137 mmol/L (ref 135–145)

## 2023-03-29 LAB — PREGNANCY, URINE: Preg Test, Ur: NEGATIVE

## 2023-03-29 MED ORDER — METOCLOPRAMIDE HCL 5 MG/ML IJ SOLN
10.0000 mg | Freq: Once | INTRAMUSCULAR | Status: AC
  Administered 2023-03-29: 10 mg via INTRAVENOUS
  Filled 2023-03-29: qty 2

## 2023-03-29 MED ORDER — SODIUM CHLORIDE 0.9 % IV BOLUS
1000.0000 mL | Freq: Once | INTRAVENOUS | Status: AC
  Administered 2023-03-29: 1000 mL via INTRAVENOUS

## 2023-03-29 MED ORDER — DEXAMETHASONE SODIUM PHOSPHATE 10 MG/ML IJ SOLN
10.0000 mg | Freq: Once | INTRAMUSCULAR | Status: AC
  Administered 2023-03-29: 10 mg via INTRAVENOUS
  Filled 2023-03-29: qty 1

## 2023-03-29 MED ORDER — DIPHENHYDRAMINE HCL 50 MG/ML IJ SOLN
25.0000 mg | Freq: Once | INTRAMUSCULAR | Status: AC
  Administered 2023-03-29: 25 mg via INTRAVENOUS
  Filled 2023-03-29: qty 1

## 2023-03-29 NOTE — ED Provider Notes (Signed)
  Provider Note MRN:  [id number]  Arrival date & time: 03/29/23    ED Course and Medical Decision Making  Assumed care from Dr. Deretha Emory at shift change.  Dizziness with headache earlier this evening CT head, labs.  Had some paresthesia to the left inner thigh.  11:45 PM update: Workup is reassuring with normal labs, CT head is normal, EKG reassuring.  On my assessment patient is feeling a lot better, no longer dizzy, headache is much improved, no longer having any numbness or weakness or paresthesia to the arms or legs.  Favoring migraine without signs of emergent process at this time, patient is appropriate for discharge with follow-up, return precautions.  Procedures  Final Clinical Impressions(s) / ED Diagnoses     ICD-10-CM   1. Dizziness  R42     2. Other migraine without status migrainosus, intractable  G43.819       ED Discharge Orders     None         Discharge Instructions      You were evaluated in the Emergency Department and after careful evaluation, we did not find any emergent condition requiring admission or further testing in the hospital.  Your exam/testing today is overall reassuring.  Recommend follow-up with your primary care doctor if your headaches continue.  Please return to the Emergency Department if you experience any worsening of your condition.   Thank you for allowing [place] to be a part of your care.    Elmer Sow. Pilar Plate, MD [facility] Emergency Medicine [facility] [email or web address]    Sabas Sous, MD 03/29/23 2351

## 2023-03-29 NOTE — Discharge Instructions (Signed)
You were evaluated in the Emergency Department and after careful evaluation, we did not find any emergent condition requiring admission or further testing in the hospital.  Your exam/testing today is overall reassuring.  Recommend follow-up with your primary care doctor if your headaches continue.  Please return to the Emergency Department if you experience any worsening of your condition.   Thank you for allowing [place] to be a part of your care.

## 2023-03-29 NOTE — ED Triage Notes (Signed)
Pt with hx of vertigo reports she has been dizzy since 6pm tonight. She took her prescribed Meclizine but it has not helped. She also reports headache with light sensitivity and blurred vision.

## 2023-03-29 NOTE — ED Provider Notes (Signed)
[place] [facility] Provider Note   CSN: [id number] Arrival date & time: 03/29/23  2211     History  Chief Complaint  Patient presents with   Dizziness    Leah Clayton is a 27 y.o. female.  Patient with a history of vertigo since February.  Has never had MRI or head CT.  Patient is treated with meclizine.  Patient this evening at 1830 had the acute onset bilateral frontal headache with some photophobia little bit of numbness to the left anterior leg and dizziness with movement.  She took her meclizine but it did not help.  The headache reminds her of her migraines.  She has never had the vertigo with the migraine.  She also had some blurred vision with it as well.  Patient has not had migraine recently.  It has been a couple years.  Patient has had the vertigo approximately every other month since February.  No nausea or vomiting.  Patient's vital signs on presentation temp 98.3 pulse 74 respirations 18 blood pressure 125/81 oxygen saturation 98%.  Past medical history significant for asthma and vertigo.  Patient has never used any tobacco products.       Home Medications Prior to Admission medications   Medication Sig Start Date End Date Taking? Authorizing Provider  albuterol (PROAIR HFA) 108 (90 Base) MCG/ACT inhaler ProAir HFA 90 mcg/actuation aerosol inhaler    [provider]  budesonide (RHINOCORT ALLERGY) 32 MCG/ACT nasal spray Place 1 spray into both nostrils 2 (two) times daily. 07/18/17   Dorena Bodo, NP  cyclobenzaprine (FLEXERIL) 5 MG tablet Take 1 tablet (5 mg total) by mouth at bedtime as needed for muscle spasms. 04/18/19   Wallis Bamberg, PA-C  levocetirizine (XYZAL) 5 MG tablet Take 1 tablet (5 mg total) by mouth every morning. 07/18/17   Dorena Bodo, NP  meclizine (ANTIVERT) 25 MG tablet Take 1 tablet (25 mg total) by mouth 3 (three) times daily as needed for dizziness. 07/04/22   Lamptey, Britta Mccreedy, MD   montelukast (SINGULAIR) 10 MG tablet Take 1 tablet (10 mg total) by mouth at bedtime. 07/04/22   Lamptey, Britta Mccreedy, MD  naproxen (NAPROSYN) 500 MG tablet Take 1 tablet (500 mg total) by mouth 2 (two) times daily. 04/18/19   Wallis Bamberg, PA-C      Allergies    Shellfish allergy    Review of Systems   Review of Systems  Constitutional:  Negative for chills and fever.  HENT:  Negative for ear pain and sore throat.   Eyes:  Positive for photophobia and visual disturbance. Negative for pain.  Respiratory:  Negative for cough and shortness of breath.   Cardiovascular:  Negative for chest pain and palpitations.  Gastrointestinal:  Negative for abdominal pain, nausea and vomiting.  Genitourinary:  Negative for dysuria and hematuria.  Musculoskeletal:  Negative for arthralgias and back pain.  Skin:  Negative for color change and rash.  Neurological:  Positive for dizziness, numbness and headaches. Negative for seizures, syncope, speech difficulty and weakness.  All other systems reviewed and are negative.   Physical Exam Updated Vital Signs BP 125/81   Pulse 74   Temp 98.3 F (36.8 C) (Oral)   Resp 18   Ht 1.638 m (5' 4.5")   Wt 70.3 kg   LMP 03/15/2023 (Approximate)   SpO2 98%   BMI 26.19 kg/m  Physical Exam Vitals and nursing note reviewed.  Constitutional:      General: She  is not in acute distress.    Appearance: Normal appearance. She is well-developed. She is not ill-appearing.  HENT:     Head: Normocephalic and atraumatic.     Mouth/Throat:     Mouth: Mucous membranes are moist.  Eyes:     Extraocular Movements: Extraocular movements intact.     Conjunctiva/sclera: Conjunctivae normal.     Pupils: Pupils are equal, round, and reactive to light.  Cardiovascular:     Rate and Rhythm: Normal rate and regular rhythm.     Heart sounds: No murmur heard. Pulmonary:     Effort: Pulmonary effort is normal. No respiratory distress.     Breath sounds: Normal breath sounds.   Abdominal:     Palpations: Abdomen is soft.     Tenderness: There is no abdominal tenderness.  Musculoskeletal:        General: No swelling.     Cervical back: Normal range of motion and neck supple. No rigidity.  Skin:    General: Skin is warm and dry.     Capillary Refill: Capillary refill takes less than 2 seconds.  Neurological:     General: No focal deficit present.     Mental Status: She is alert and oriented to person, place, and time.     Cranial Nerves: No cranial nerve deficit.     Sensory: Sensory deficit present.     Motor: No weakness.     Comments: Questionable sensory deficit to left anterior shin.  Seems to be more subjective.  Psychiatric:        Mood and Affect: Mood normal.     ED Results / Procedures / Treatments   Labs (all labs ordered are listed, but only abnormal results are displayed) Labs Reviewed  CBC WITH DIFFERENTIAL/PLATELET  BASIC METABOLIC PANEL  URINALYSIS, ROUTINE W REFLEX MICROSCOPIC  PREGNANCY, URINE    EKG None  Radiology No results found.  Procedures Procedures    Medications Ordered in ED Medications  sodium chloride 0.9 % bolus 1,000 mL (has no administration in time range)  diphenhydrAMINE (BENADRYL) injection 25 mg (has no administration in time range)  metoCLOPramide (REGLAN) injection 10 mg (has no administration in time range)  dexamethasone (DECADRON) injection 10 mg (has no administration in time range)    ED Course/ Medical Decision Making/ A&P                                 Medical Decision Making Amount and/or Complexity of Data Reviewed Labs: ordered. Radiology: ordered.  Risk Prescription drug management.   Patient with past history of migraines.  The headache and the symptoms other than the vertigo are consistent with her migraines.  Will give a migraine cocktail for that 1 L of fluid.  She has been experiencing vertigo on and off since February.  But is never had a headache with it.  All of the  symptoms today started abruptly at 1830 including some of the numbness to the left anterior leg.  Will go ahead and get CT head since she has never had imaging of her head will get labs.  And will give migraine cocktail as stated above as well as fluids.  She will receive Reglan Decadron and Benadryl.  And then she will be reassessed.   Final Clinical Impression(s) / ED Diagnoses Final diagnoses:  Dizziness  Other migraine without status migrainosus, intractable    Rx / DC Orders ED Discharge Orders  None         Vanetta Mulders, MD 03/29/23 2253

## 2023-03-29 NOTE — ED Notes (Signed)
Patient transported to CT
